# Patient Record
Sex: Male | Born: 2000 | Race: White | Hispanic: No | Marital: Single | Smoking: Never smoker
Health system: Southern US, Community
[De-identification: ages and names within clinical notes are randomized; demographics above are authoritative.]

## PROBLEM LIST (undated history)

## (undated) DIAGNOSIS — F909 Attention-deficit hyperactivity disorder, unspecified type: Secondary | ICD-10-CM

## (undated) HISTORY — PX: WISDOM TOOTH EXTRACTION: SHX21

## (undated) HISTORY — PX: OTHER SURGICAL HISTORY: SHX169

---
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
## 2019-12-07 DIAGNOSIS — Z20822 Contact with and (suspected) exposure to covid-19: Secondary | ICD-10-CM | POA: Diagnosis not present

## 2020-02-09 DIAGNOSIS — G8911 Acute pain due to trauma: Secondary | ICD-10-CM | POA: Diagnosis not present

## 2020-02-09 DIAGNOSIS — S02671A Fracture of alveolus of right mandible, initial encounter for closed fracture: Secondary | ICD-10-CM | POA: Diagnosis not present

## 2020-02-09 DIAGNOSIS — S02601B Fracture of unspecified part of body of right mandible, initial encounter for open fracture: Secondary | ICD-10-CM | POA: Diagnosis not present

## 2020-02-09 DIAGNOSIS — S0990XA Unspecified injury of head, initial encounter: Secondary | ICD-10-CM | POA: Diagnosis not present

## 2020-02-09 DIAGNOSIS — S02609A Fracture of mandible, unspecified, initial encounter for closed fracture: Secondary | ICD-10-CM | POA: Diagnosis not present

## 2020-02-09 DIAGNOSIS — Z20822 Contact with and (suspected) exposure to covid-19: Secondary | ICD-10-CM | POA: Diagnosis not present

## 2020-02-09 DIAGNOSIS — S02601A Fracture of unspecified part of body of right mandible, initial encounter for closed fracture: Secondary | ICD-10-CM | POA: Diagnosis not present

## 2020-02-09 DIAGNOSIS — Y92410 Unspecified street and highway as the place of occurrence of the external cause: Secondary | ICD-10-CM | POA: Diagnosis not present

## 2020-02-09 DIAGNOSIS — S199XXA Unspecified injury of neck, initial encounter: Secondary | ICD-10-CM | POA: Diagnosis not present

## 2020-03-20 ENCOUNTER — Other Ambulatory Visit: Payer: Self-pay | Admitting: Otolaryngology

## 2020-03-20 ENCOUNTER — Other Ambulatory Visit: Payer: Self-pay

## 2020-03-20 ENCOUNTER — Inpatient Hospital Stay (HOSPITAL_COMMUNITY): Payer: BC Managed Care – PPO | Admitting: Certified Registered Nurse Anesthetist

## 2020-03-20 ENCOUNTER — Encounter (HOSPITAL_COMMUNITY): Payer: Self-pay | Admitting: Otolaryngology

## 2020-03-20 ENCOUNTER — Ambulatory Visit
Admission: RE | Admit: 2020-03-20 | Discharge: 2020-03-20 | Disposition: A | Discharge status: Home / Self Care | Payer: BC Managed Care – PPO | Source: Ambulatory Visit | Attending: Otolaryngology | Admitting: Otolaryngology

## 2020-03-20 ENCOUNTER — Encounter (HOSPITAL_COMMUNITY)
Admission: RE | Disposition: A | Discharge status: Home / Self Care | Payer: Self-pay | Source: Ambulatory Visit | Attending: Otolaryngology

## 2020-03-20 ENCOUNTER — Observation Stay (HOSPITAL_COMMUNITY)
Admission: RE | Admit: 2020-03-20 | Discharge: 2020-03-21 | Disposition: A | Discharge status: Home / Self Care | Payer: BC Managed Care – PPO | Source: Ambulatory Visit | Attending: Otolaryngology | Admitting: Otolaryngology

## 2020-03-20 DIAGNOSIS — M272 Inflammatory conditions of jaws: Secondary | ICD-10-CM

## 2020-03-20 DIAGNOSIS — X58XXXA Exposure to other specified factors, initial encounter: Secondary | ICD-10-CM | POA: Insufficient documentation

## 2020-03-20 DIAGNOSIS — L03221 Cellulitis of neck: Secondary | ICD-10-CM | POA: Diagnosis not present

## 2020-03-20 DIAGNOSIS — L0211 Cutaneous abscess of neck: Secondary | ICD-10-CM | POA: Diagnosis not present

## 2020-03-20 DIAGNOSIS — S02609K Fracture of mandible, unspecified, subsequent encounter for fracture with nonunion: Secondary | ICD-10-CM | POA: Diagnosis not present

## 2020-03-20 DIAGNOSIS — S02601K Fracture of unspecified part of body of right mandible, subsequent encounter for fracture with nonunion: Secondary | ICD-10-CM | POA: Diagnosis present

## 2020-03-20 DIAGNOSIS — T8859XA Other complications of anesthesia, initial encounter: Secondary | ICD-10-CM

## 2020-03-20 DIAGNOSIS — Z20822 Contact with and (suspected) exposure to covid-19: Secondary | ICD-10-CM | POA: Insufficient documentation

## 2020-03-20 HISTORY — PX: ORIF MANDIBULAR FRACTURE: SHX2127

## 2020-03-20 HISTORY — DX: Other complications of anesthesia, initial encounter: T88.59XA

## 2020-03-20 LAB — SARS CORONAVIRUS 2 BY RT PCR (HOSPITAL ORDER, PERFORMED IN ~~LOC~~ HOSPITAL LAB): SARS Coronavirus 2: NEGATIVE

## 2020-03-20 LAB — HEMOGLOBIN: Hemoglobin: 15.1 g/dL (ref 13.0–17.0)

## 2020-03-20 SURGERY — OPEN REDUCTION INTERNAL FIXATION (ORIF) MANDIBULAR FRACTURE
Anesthesia: General | Laterality: Right

## 2020-03-20 MED ORDER — DEXMEDETOMIDINE HCL 200 MCG/2ML IV SOLN
INTRAVENOUS | Status: DC | PRN
  Administered 2020-03-20 (×2): 40 ug via INTRAVENOUS

## 2020-03-20 MED ORDER — HYDROMORPHONE HCL 1 MG/ML IJ SOLN
INTRAMUSCULAR | Status: AC
  Filled 2020-03-20: qty 0.5

## 2020-03-20 MED ORDER — OXYCODONE HCL 5 MG/5ML PO SOLN: 5.0000 mg | Freq: Once | ORAL | Status: DC | PRN

## 2020-03-20 MED ORDER — MIDAZOLAM HCL 5 MG/5ML IJ SOLN
INTRAMUSCULAR | Status: DC | PRN
  Administered 2020-03-20: 2 mg via INTRAVENOUS

## 2020-03-20 MED ORDER — PROPOFOL 10 MG/ML IV BOLUS
INTRAVENOUS | Status: AC
  Filled 2020-03-20: qty 20

## 2020-03-20 MED ORDER — LIDOCAINE-EPINEPHRINE 1 %-1:100000 IJ SOLN
INTRAMUSCULAR | Status: AC
  Filled 2020-03-20: qty 1

## 2020-03-20 MED ORDER — ORAL CARE MOUTH RINSE: 15.0000 mL | Freq: Once | OROMUCOSAL | Status: DC

## 2020-03-20 MED ORDER — ACETAMINOPHEN 10 MG/ML IV SOLN: 1000.0000 mg | Freq: Once | INTRAVENOUS | Status: DC | PRN

## 2020-03-20 MED ORDER — DEXAMETHASONE SODIUM PHOSPHATE 10 MG/ML IJ SOLN
INTRAMUSCULAR | Status: AC
  Filled 2020-03-20: qty 1

## 2020-03-20 MED ORDER — MORPHINE SULFATE (PF) 2 MG/ML IV SOLN: 2.0000 mg | INTRAVENOUS | Status: DC | PRN

## 2020-03-20 MED ORDER — SUGAMMADEX SODIUM 200 MG/2ML IV SOLN
INTRAVENOUS | Status: DC | PRN
  Administered 2020-03-20: 200 mg via INTRAVENOUS

## 2020-03-20 MED ORDER — CLINDAMYCIN PHOSPHATE 600 MG/50ML IV SOLN
600.0000 mg | Freq: Once | INTRAVENOUS | Status: AC
  Administered 2020-03-20: 600 mg via INTRAVENOUS
  Filled 2020-03-20: qty 50

## 2020-03-20 MED ORDER — DEXAMETHASONE SODIUM PHOSPHATE 10 MG/ML IJ SOLN
INTRAMUSCULAR | Status: DC | PRN
  Administered 2020-03-20: 10 mg via INTRAVENOUS

## 2020-03-20 MED ORDER — HYDROMORPHONE HCL 1 MG/ML IJ SOLN
INTRAMUSCULAR | Status: DC | PRN
  Administered 2020-03-20: .5 mg via INTRAVENOUS

## 2020-03-20 MED ORDER — ROCURONIUM BROMIDE 10 MG/ML (PF) SYRINGE
PREFILLED_SYRINGE | INTRAVENOUS | Status: AC
  Filled 2020-03-20: qty 10

## 2020-03-20 MED ORDER — FENTANYL CITRATE (PF) 100 MCG/2ML IJ SOLN: 25.0000 ug | INTRAMUSCULAR | Status: DC | PRN

## 2020-03-20 MED ORDER — ROCURONIUM BROMIDE 10 MG/ML (PF) SYRINGE
PREFILLED_SYRINGE | INTRAVENOUS | Status: DC | PRN
  Administered 2020-03-20: 70 mg via INTRAVENOUS

## 2020-03-20 MED ORDER — ONDANSETRON HCL 4 MG/2ML IJ SOLN
INTRAMUSCULAR | Status: AC
  Filled 2020-03-20: qty 2

## 2020-03-20 MED ORDER — OXYMETAZOLINE HCL 0.05 % NA SOLN
NASAL | Status: AC
  Filled 2020-03-20: qty 30

## 2020-03-20 MED ORDER — BACITRACIN ZINC 500 UNIT/GM EX OINT
TOPICAL_OINTMENT | CUTANEOUS | Status: AC
  Filled 2020-03-20: qty 28.35

## 2020-03-20 MED ORDER — KCL IN DEXTROSE-NACL 20-5-0.45 MEQ/L-%-% IV SOLN
INTRAVENOUS | Status: DC
  Filled 2020-03-20: qty 1000

## 2020-03-20 MED ORDER — LIDOCAINE-EPINEPHRINE 1 %-1:100000 IJ SOLN
INTRAMUSCULAR | Status: DC | PRN
  Administered 2020-03-20: 1 mL

## 2020-03-20 MED ORDER — HYDROCODONE-ACETAMINOPHEN 7.5-325 MG/15ML PO SOLN
10.0000 mL | ORAL | Status: DC | PRN
  Administered 2020-03-20 – 2020-03-21 (×2): 15 mL via ORAL
  Filled 2020-03-20 (×2): qty 15

## 2020-03-20 MED ORDER — ONDANSETRON HCL 4 MG/2ML IJ SOLN
INTRAMUSCULAR | Status: DC | PRN
  Administered 2020-03-20: 4 mg via INTRAVENOUS

## 2020-03-20 MED ORDER — 0.9 % SODIUM CHLORIDE (POUR BTL) OPTIME
TOPICAL | Status: DC | PRN
Start: 2020-03-20 — End: 2020-03-20
  Administered 2020-03-20: 1000 mL

## 2020-03-20 MED ORDER — IOPAMIDOL (ISOVUE-300) INJECTION 61%
75.0000 mL | Freq: Once | INTRAVENOUS | Status: AC | PRN
  Administered 2020-03-20: 75 mL via INTRAVENOUS

## 2020-03-20 MED ORDER — ACETAMINOPHEN 160 MG/5ML PO SOLN: 1000.0000 mg | Freq: Once | ORAL | Status: DC | PRN

## 2020-03-20 MED ORDER — OXYMETAZOLINE HCL 0.05 % NA SOLN
NASAL | Status: DC | PRN
  Administered 2020-03-20: 1 via NASAL

## 2020-03-20 MED ORDER — PROPOFOL 10 MG/ML IV BOLUS
INTRAVENOUS | Status: DC | PRN
  Administered 2020-03-20: 200 mg via INTRAVENOUS

## 2020-03-20 MED ORDER — CLINDAMYCIN PHOSPHATE 600 MG/50ML IV SOLN
600.0000 mg | Freq: Three times a day (TID) | INTRAVENOUS | Status: DC
  Administered 2020-03-21 (×2): 600 mg via INTRAVENOUS
  Filled 2020-03-20 (×2): qty 50

## 2020-03-20 MED ORDER — ONDANSETRON HCL 4 MG/2ML IJ SOLN: 4.0000 mg | INTRAMUSCULAR | Status: DC | PRN

## 2020-03-20 MED ORDER — MIDAZOLAM HCL 2 MG/2ML IJ SOLN
INTRAMUSCULAR | Status: AC
  Filled 2020-03-20: qty 2

## 2020-03-20 MED ORDER — FENTANYL CITRATE (PF) 250 MCG/5ML IJ SOLN
INTRAMUSCULAR | Status: AC
  Filled 2020-03-20: qty 5

## 2020-03-20 MED ORDER — LACTATED RINGERS IV SOLN: INTRAVENOUS | Status: DC

## 2020-03-20 MED ORDER — ACETAMINOPHEN 500 MG PO TABS: 1000.0000 mg | ORAL_TABLET | Freq: Once | ORAL | Status: DC | PRN

## 2020-03-20 MED ORDER — ONDANSETRON HCL 4 MG PO TABS: 4.0000 mg | ORAL_TABLET | ORAL | Status: DC | PRN

## 2020-03-20 MED ORDER — LIDOCAINE 2% (20 MG/ML) 5 ML SYRINGE
INTRAMUSCULAR | Status: AC
  Filled 2020-03-20: qty 5

## 2020-03-20 MED ORDER — CHLORHEXIDINE GLUCONATE 0.12 % MT SOLN
15.0000 mL | Freq: Once | OROMUCOSAL | Status: DC
  Filled 2020-03-20: qty 15

## 2020-03-20 MED ORDER — OXYCODONE HCL 5 MG PO TABS: 5.0000 mg | ORAL_TABLET | Freq: Once | ORAL | Status: DC | PRN

## 2020-03-20 MED ORDER — FENTANYL CITRATE (PF) 250 MCG/5ML IJ SOLN
INTRAMUSCULAR | Status: DC | PRN
  Administered 2020-03-20: 150 ug via INTRAVENOUS
  Administered 2020-03-20: 100 ug via INTRAVENOUS

## 2020-03-20 SURGICAL SUPPLY — 45 items
BLADE CLIPPER SURG (BLADE) IMPLANT
BLADE SURG 15 STRL LF DISP TIS (BLADE) IMPLANT
BLADE SURG 15 STRL SS (BLADE)
BNDG GAUZE ELAST 4 BULKY (GAUZE/BANDAGES/DRESSINGS) ×2 IMPLANT
CANISTER SUCT 3000ML PPV (MISCELLANEOUS) ×3 IMPLANT
CLEANER TIP ELECTROSURG 2X2 (MISCELLANEOUS) ×3 IMPLANT
COVER SURGICAL LIGHT HANDLE (MISCELLANEOUS) ×3 IMPLANT
COVER WAND RF STERILE (DRAPES) ×3 IMPLANT
DECANTER SPIKE VIAL GLASS SM (MISCELLANEOUS) ×3 IMPLANT
DRAIN PENROSE 1/4X12 LTX STRL (WOUND CARE) ×2 IMPLANT
DRAPE HALF SHEET 40X57 (DRAPES) IMPLANT
ELECT COATED BLADE 2.86 ST (ELECTRODE) ×3 IMPLANT
ELECT REM PT RETURN 9FT ADLT (ELECTROSURGICAL) ×3
ELECTRODE REM PT RTRN 9FT ADLT (ELECTROSURGICAL) ×1 IMPLANT
GLOVE BIO SURGEON STRL SZ7.5 (GLOVE) ×3 IMPLANT
GOWN STRL REUS W/ TWL LRG LVL3 (GOWN DISPOSABLE) ×2 IMPLANT
GOWN STRL REUS W/TWL LRG LVL3 (GOWN DISPOSABLE) ×6
KIT BASIN OR (CUSTOM PROCEDURE TRAY) ×3 IMPLANT
KIT TURNOVER KIT B (KITS) ×3 IMPLANT
NDL HYPO 25GX1X1/2 BEV (NEEDLE) IMPLANT
NDL PRECISIONGLIDE 27X1.5 (NEEDLE) ×1 IMPLANT
NEEDLE HYPO 25GX1X1/2 BEV (NEEDLE) IMPLANT
NEEDLE PRECISIONGLIDE 27X1.5 (NEEDLE) ×3 IMPLANT
NS IRRIG 1000ML POUR BTL (IV SOLUTION) ×3 IMPLANT
PAD ARMBOARD 7.5X6 YLW CONV (MISCELLANEOUS) ×6 IMPLANT
PENCIL SMOKE EVACUATOR (MISCELLANEOUS) ×3 IMPLANT
POSITIONER HEAD DONUT 9IN (MISCELLANEOUS) IMPLANT
SCISSORS WIRE ANG 4 3/4 DISP (INSTRUMENTS) ×3 IMPLANT
SCREW MNDBLE 2.0X8 LOCKING (Screw) ×4 IMPLANT
SUT ETHILON 2 0 FS 18 (SUTURE) ×2 IMPLANT
SUT ETHILON 4 0 CL P 3 (SUTURE) IMPLANT
SUT MON AB 3-0 SH 27 (SUTURE) ×4
SUT MON AB 3-0 SH27 (SUTURE) ×2 IMPLANT
SUT PROLENE 6 0 PC 1 (SUTURE) IMPLANT
SUT STEEL 0 (SUTURE)
SUT STEEL 0 18XMFL TIE 17 (SUTURE) IMPLANT
SUT STEEL 1 (SUTURE) ×3 IMPLANT
SUT STEEL 2 (SUTURE) IMPLANT
SUT STEEL 4 (SUTURE) ×3 IMPLANT
SUT VICRYL 4-0 PS2 18IN ABS (SUTURE) IMPLANT
TOWEL GREEN STERILE FF (TOWEL DISPOSABLE) ×3 IMPLANT
TRAY ENT MC OR (CUSTOM PROCEDURE TRAY) ×3 IMPLANT
TRAY FOLEY MTR SLVR 14FR STAT (SET/KITS/TRAYS/PACK) IMPLANT
WATER STERILE IRR 1000ML POUR (IV SOLUTION) ×3 IMPLANT
WIRE GUIDE MODEL 22X500MM (WIRE) ×2 IMPLANT

## 2020-03-20 NOTE — Brief Op Note (Signed)
03/20/2020  8:23 PM  PATIENT:  Austin [name]  19 y.o. male  PRE-OPERATIVE DIAGNOSIS:  neck abscess, right body mandible fracture non-union  POST-OPERATIVE DIAGNOSIS:  neck abscess, right body mandible fracture non-union  PROCEDURE:  Procedure(s): REVISION OF  MANDIBULAR  FIXATION AND IRRIGATION AND DEBRIDEMENT OF NECK ABSCESS (Right)  SURGEON:  Surgeon(s) and Role:    Christia Reading, MD - Primary  PHYSICIAN ASSISTANT:   ASSISTANTS: none   ANESTHESIA:   general  EBL:  25 cc  BLOOD ADMINISTERED:none  DRAINS: Penrose drain in the right neck   LOCAL MEDICATIONS USED:  LIDOCAINE   SPECIMEN:  No Specimen  DISPOSITION OF SPECIMEN:  N/A  COUNTS:  YES  TOURNIQUET:  * No tourniquets in log *  DICTATION: .Other Dictation: Dictation Number [id number]  PLAN OF CARE: Admit for overnight observation  PATIENT DISPOSITION:  PACU - hemodynamically stable.   Delay start of Pharmacological VTE agent (>24hrs) due to surgical blood loss or risk of bleeding: no

## 2020-03-20 NOTE — H&P (Signed)
Austin [name] is an 19 y.o. male.   Chief Complaint: Right mandible fracture, neck abscess HPI: 19 year old male suffered right body mandible fracture 4/22 and underwent MMF with arch bars the next day.  He remained in fixation for five weeks and wires were the removed.  Within a few days, he developed swelling and pain of the right peri-mandibular soft tissues.  Yesterday, the neck started draining.  He underwent neck CT today and presents for surgical drainage and replacement of MMF wires.  History reviewed. No pertinent past medical history.  Past Surgical History:  Procedure Laterality Date  . jaw wired    . WISDOM TOOTH EXTRACTION      History reviewed. No pertinent family history. Social History:  reports that he has never smoked. He has never used smokeless tobacco. He reports that he does not drink alcohol or use drugs.  Allergies: No Known Allergies  Medications Prior to Admission  Medication Sig Dispense Refill  . oxyCODONE (ROXICODONE) 5 MG/5ML solution Take 3 mLs by mouth See admin instructions. Take 10 times a day    . sulfamethoxazole-trimethoprim (BACTRIM DS) 800-160 MG tablet Take 1 tablet by mouth 2 (two) times daily.    Marland Kitchen ibuprofen (ADVIL,MOTRIN) 600 MG tablet Take 1 tablet (600 mg total) by mouth every 6 (six) hours as needed. (Patient not taking: Reported on 03/20/2020) 30 tablet 0    Results for orders placed or performed during the hospital encounter of 03/20/20 (from the past 48 hour(s))  Hemoglobin     Status: None   Collection Time: 03/20/20  2:13 PM  Result Value Ref Range   Hemoglobin 15.1 13.0 - 17.0 g/dL    Comment: Performed at [facility], [place] [address] [place], [place] [address]  SARS Coronavirus 2 by RT PCR (hospital order, performed in [facility] hospital lab) Nasopharyngeal Nasopharyngeal Swab     Status: None   Collection Time: 03/20/20  2:48 PM   Specimen: Nasopharyngeal Swab  Result Value Ref Range   SARS Coronavirus 2 NEGATIVE NEGATIVE     Comment: (NOTE) SARS-CoV-2 target nucleic acids are NOT DETECTED. The SARS-CoV-2 RNA is generally detectable in upper and lower respiratory specimens during the acute phase of infection. The lowest concentration of SARS-CoV-2 viral copies this assay can detect is 250 copies / mL. A negative result does not preclude SARS-CoV-2 infection and should not be used as the sole basis for treatment or other patient management decisions.  A negative result may occur with improper specimen collection / handling, submission of specimen other than nasopharyngeal swab, presence of viral mutation(s) within the areas targeted by this assay, and inadequate number of viral copies (<250 copies / mL). A negative result must be combined with clinical observations, patient history, and epidemiological information. Fact Sheet for Patients:   StrictlyIdeas.no Fact Sheet for Healthcare Providers: BankingDealers.co.za This test is not yet approved or cleared  by the [place] FDA and has been authorized for detection and/or diagnosis of SARS-CoV-2 by FDA under an Emergency Use Authorization (EUA).  This EUA will remain in effect (meaning this test can be used) for the duration of the COVID-19 declaration under Section 564(b)(1) of the Act, 21 U.S.C. section 360bbb-3(b)(1), unless the authorization is terminated or revoked sooner. Performed at [facility], [place] [address], [place], [place] [address]    CT SOFT TISSUE NECK W CONTRAST  Result Date: 03/20/2020 CLINICAL DATA:  Mandibular abscess. Cellulitis and abscess of neck. Mandibular abscess/cellulitis and  abscess of neck. EXAM: CT NECK WITH CONTRAST TECHNIQUE: Multidetector CT imaging of the neck was performed using the standard protocol following the bolus administration of intravenous contrast. CONTRAST:  40mL ISOVUE-300 IOPAMIDOL (ISOVUE-300) INJECTION 61% COMPARISON:  No pertinent prior studies  available for comparison. FINDINGS: Pharynx and larynx: There is no appreciable swelling or discrete mass within the oral cavity, pharynx or larynx. Salivary glands: The parotid glands are unremarkable. Inflammatory stranding surrounds both submandibular glands. Thyroid: Unremarkable. Lymph nodes: Bilateral cervical lymphadenopathy, most notably at the level II and I stations, likely reactive. Vascular: The major vascular structures of the neck are patent. Limited intracranial: No abnormality identified. Visualized orbits: Incompletely imaged. Visualized orbits show no acute finding. Mastoids and visualized paranasal sinuses: Trace mucosal thickening within the right maxillary sinus. Skeleton: Comminuted fracture of the right mandibular body which appears acute or early subacute, status post wire fixation. Upper chest: No consolidation within the imaged lung apices. Other: Immediately superficial to the right mandibular body fracture, there is a peripherally enhancing hypodense region measuring 4.9 x 1.3 x 2.9 cm which may reflect phlegmon or early abscess (series 3, image 48) (series 5, image 21). More inferolaterally within the right perimandibular region, there is a 3.4 x 2.1 cm region of induration which appears to predominantly reflect phlegmon, although there may be small central liquid components (series 3, image 58) (series 5, image 26). Additionally, there is a probable small abscess along the inferior aspect of the right masseter muscle measuring 1.6 cm (series 3, image 50). Cellulitis changes extend into the upper neck soft tissues. These results will be called to the ordering clinician or representative by the Radiologist Assistant, and communication documented in the PACS or [employer]. IMPRESSION: Comminuted fracture of the right mandibular body which appears acute or early subacute, status post wire fixation. Prominent cellulitis changes within the right greater than left perimandibular regions  and extending into the upper neck. Immediately superficial to the right mandibular body fracture, there is a peripherally enhancing hypodense region measuring 4.9 x 1.3 x 2.9 cm, which may reflect phlegmon or early abscess. More inferolaterally within the right perimandibular region, there is a 3.4 x 2.1 cm region of soft tissue induration which appears to predominantly reflect phlegmon, although there may be small central liquid components. Additionally, there is a small abscess along the inferior aspect of the right masseter muscle measuring 1.6 cm. Electronically Signed   By: Jackey Loge DO   On: 03/20/2020 12:59    Review of Systems  All other systems reviewed and are negative.   Blood pressure 140/82, pulse 95, temperature 98.3 F (36.8 C), temperature source Oral, resp. rate 18, height 6' (1.829 m), weight 75.8 kg, SpO2 97 %. Physical Exam  Constitutional: He is oriented to person, place, and time. He appears well-developed and well-nourished. No distress.  HENT:  Head: Normocephalic and atraumatic.  Right Ear: External ear normal.  Left Ear: External ear normal.  Nose: Nose normal.  Teeth with good occlusion with arch bars in place.  Pain with flexion of fracture site.  Eyes: Pupils are equal, round, and reactive to light. Conjunctivae and EOM are normal.  Neck:  Right peri-mandibular edema and draining site.  Cardiovascular: Normal rate.  Respiratory: Effort normal.  Neurological: He is alert and oriented to person, place, and time. A cranial nerve deficit (Right chin numbness) is present.  Skin: Skin is warm and dry.  Psychiatric: He has a normal mood and affect. His behavior is normal. Judgment and  thought content normal.     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  Christia Reading, MD 03/20/2020, 7:21 PM

## 2020-03-20 NOTE — Anesthesia Procedure Notes (Signed)
Procedure Name: Intubation Date/Time: 03/20/2020 7:46 PM Performed by: Adonis Housekeeper, CRNA Pre-anesthesia Checklist: Patient identified, Emergency Drugs available, Suction available and Patient being monitored Patient Re-evaluated:Patient Re-evaluated prior to induction Oxygen Delivery Method: Circle system utilized Preoxygenation: Pre-oxygenation with 100% oxygen Induction Type: IV induction Ventilation: Mask ventilation without difficulty Laryngoscope Size: Glidescope and 4 Grade View: Grade I Nasal Tubes: Right, Nasal prep performed and Nasal Rae Tube size: 7.0 mm Number of attempts: 1 Placement Confirmation: ETT inserted through vocal cords under direct vision,  positive ETCO2 and breath sounds checked- equal and bilateral Tube secured with: Tape Dental Injury: Teeth and Oropharynx as per pre-operative assessment

## 2020-03-20 NOTE — Transfer of Care (Signed)
Immediate Anesthesia Transfer of Care Note  Patient: Austin [name]  Procedure(s) Performed: REVISION OF  MANDIBULAR  FIXATION AND IRRIGATION AND DEBRIDEMENT OF NECK ABSCESS (Right )  Patient Location: PACU  Anesthesia Type:General  Level of Consciousness: drowsy  Airway & Oxygen Therapy: Patient Spontanous Breathing  Post-op Assessment: Report given to RN and Post -op Vital signs reviewed and stable  Post vital signs: Reviewed and stable  Last Vitals:  Vitals Value Taken Time  BP 101/50 03/20/20 2037  Temp 36.3 C 03/20/20 2037  Pulse 84 03/20/20 2040  Resp 17 03/20/20 2040  SpO2 93 % 03/20/20 2040  Vitals shown include unvalidated device data.  Last Pain:  Vitals:   03/20/20 2037  TempSrc:   PainSc: Asleep      Patients Stated Pain Goal: 0 (03/20/20 1427)  Complications: No apparent anesthesia complications

## 2020-03-20 NOTE — Anesthesia Preprocedure Evaluation (Signed)
Anesthesia Evaluation  Patient identified by MRN, date of birth, ID band Patient awake    Reviewed: Allergy & Precautions, NPO status , Patient's Chart, lab work & pertinent test results  History of Anesthesia Complications Negative for: history of anesthetic complications  Airway Mallampati: IV  TM Distance: >3 FB Neck ROM: Full  Mouth opening: Limited Mouth Opening  Dental  (+) Dental Advisory Given   Pulmonary neg pulmonary ROS, neg recent URI,    breath sounds clear to auscultation       Cardiovascular negative cardio ROS   Rhythm:Regular     Neuro/Psych negative neurological ROS  negative psych ROS   GI/Hepatic negative GI ROS, Neg liver ROS,   Endo/Other  negative endocrine ROS  Renal/GU negative Renal ROS     Musculoskeletal negative musculoskeletal ROS (+)   Abdominal   Peds  Hematology negative hematology ROS (+)   Anesthesia Other Findings   Reproductive/Obstetrics                             Anesthesia Physical Anesthesia Plan  ASA: I  Anesthesia Plan: General   Post-op Pain Management:    Induction: Intravenous  PONV Risk Score and Plan: 2 and Ondansetron and Dexamethasone  Airway Management Planned: Nasal ETT  Additional Equipment: None  Intra-op Plan:   Post-operative Plan: Extubation in OR  Informed Consent: I have reviewed the patients History and Physical, chart, labs and discussed the procedure including the risks, benefits and alternatives for the proposed anesthesia with the patient or authorized representative who has indicated his/her understanding and acceptance.     Dental advisory given  Plan Discussed with: CRNA and Surgeon  Anesthesia Plan Comments:         Anesthesia Quick Evaluation

## 2020-03-21 ENCOUNTER — Encounter (HOSPITAL_COMMUNITY): Payer: Self-pay | Admitting: Otolaryngology

## 2020-03-21 DIAGNOSIS — X58XXXA Exposure to other specified factors, initial encounter: Secondary | ICD-10-CM | POA: Diagnosis not present

## 2020-03-21 DIAGNOSIS — L0211 Cutaneous abscess of neck: Secondary | ICD-10-CM | POA: Diagnosis not present

## 2020-03-21 DIAGNOSIS — Z20822 Contact with and (suspected) exposure to covid-19: Secondary | ICD-10-CM | POA: Diagnosis not present

## 2020-03-21 DIAGNOSIS — S02609K Fracture of mandible, unspecified, subsequent encounter for fracture with nonunion: Secondary | ICD-10-CM | POA: Diagnosis not present

## 2020-03-21 DIAGNOSIS — S02601K Fracture of unspecified part of body of right mandible, subsequent encounter for fracture with nonunion: Secondary | ICD-10-CM | POA: Diagnosis not present

## 2020-03-21 HISTORY — PX: MANDIBLE SURGERY: SHX707

## 2020-03-21 MED ORDER — CLINDAMYCIN PALMITATE HCL 75 MG/5ML PO SOLR: 300.0000 mg | Freq: Three times a day (TID) | ORAL | 0 refills | Status: AC

## 2020-03-21 NOTE — Anesthesia Postprocedure Evaluation (Signed)
Anesthesia Post Note  Patient: [name]  Procedure(s) Performed: REVISION OF  MANDIBULAR  FIXATION AND IRRIGATION AND DEBRIDEMENT OF NECK ABSCESS (Right )     Patient location during evaluation: PACU Anesthesia Type: General Level of consciousness: awake and alert Pain management: pain level controlled Vital Signs Assessment: post-procedure vital signs reviewed and stable Respiratory status: spontaneous breathing, nonlabored ventilation, respiratory function stable and patient connected to nasal cannula oxygen Cardiovascular status: blood pressure returned to baseline and stable Postop Assessment: no apparent nausea or vomiting Anesthetic complications: no    Last Vitals:  Vitals:   03/20/20 2205 03/21/20 0152  BP: 107/66 122/75  Pulse: 72 62  Resp: 17 17  Temp: 36.8 C (!) 36.4 C  SpO2: 97% 99%    Last Pain:  Vitals:   03/21/20 0152  TempSrc: Oral  PainSc:                  Barrie Sigmund

## 2020-03-21 NOTE — Plan of Care (Signed)
  Problem: Education: Goal: Knowledge of General Education information will improve Description Including pain rating scale, medication(s)/side effects and non-pharmacologic comfort measures Outcome: Progressing   Problem: Health Behavior/Discharge Planning: Goal: Ability to manage health-related needs will improve Outcome: Progressing   Problem: Clinical Measurements: Goal: Ability to maintain clinical measurements within normal limits will improve Outcome: Progressing Goal: Will remain free from infection Outcome: Progressing Goal: Diagnostic test results will improve Outcome: Progressing Goal: Respiratory complications will improve Outcome: Progressing Goal: Cardiovascular complication will be avoided Outcome: Progressing   Problem: Activity: Goal: Risk for activity intolerance will decrease Outcome: Progressing   Problem: Nutrition: Goal: Adequate nutrition will be maintained Outcome: Progressing   Problem: Coping: Goal: Level of anxiety will decrease Outcome: Progressing   Problem: Elimination: Goal: Will not experience complications related to urinary retention Outcome: Progressing   Problem: Pain Managment: Goal: General experience of comfort will improve Outcome: Progressing   Problem: Safety: Goal: Ability to remain free from injury will improve Outcome: Progressing   Problem: Skin Integrity: Goal: Risk for impaired skin integrity will decrease Outcome: Progressing   Problem: Education: Goal: Required Educational Video(s) Outcome: Progressing   Problem: Clinical Measurements: Goal: Postoperative complications will be avoided or minimized Outcome: Progressing   Problem: Skin Integrity: Goal: Demonstration of wound healing without infection will improve Outcome: Progressing   

## 2020-03-21 NOTE — Discharge Summary (Signed)
Physician Discharge Summary  Patient ID: Austin [name] MRN: [id number] DOB/AGE: 04-01-2001 19 y.o.  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  Wires were cut after five weeks and his right neck swelled and began draining pus a few days later.  He was brought to the operating room for replacement of MMF wires and drainage of the infection.  See operative note.  He was observed overnight and did well.  On POD 1, he is felt stable for discharge with the Penrose drain in place.  Consults: None  Significant Diagnostic Studies: None  Treatments: surgery: Revision maxillomandibular fixation, incision and drainage right neck  Discharge Exam: Blood pressure 130/67, pulse 79, temperature 97.8 F (36.6 C), temperature source Oral, resp. rate 18, height 6' (1.829 m), weight 75.8 kg, SpO2 100 %. General appearance: alert, cooperative and no distress Neck: teeth in tight fixation, right neck Penrose drain in place with minor drainage  Disposition: Discharge disposition: 01-Home or Self Care       Discharge Instructions    Diet - low sodium heart healthy   Complete by: As directed    Discharge instructions   Complete by: As directed    Keep wire cutters handy.  Cut wires only if necessary if you need to vomit or are having trouble breathing.  If necessary, call Dr. Jenne Pane immediately.  Resume wired jaw diet.  Change gauze dressing to drain site at least once daily.  Keep drain area dry.   Increase activity slowly   Complete by: As directed      Allergies as of 03/21/2020   No Known Allergies     Medication List     STOP taking these medications   sulfamethoxazole-trimethoprim 800-160 MG tablet Commonly known as: BACTRIM DS     TAKE these medications   clindamycin 75 MG/5ML solution Commonly known as: CLEOCIN Take 20 mLs (300 mg total) by mouth 3 (three) times daily for 10 days.   ibuprofen 600 MG tablet Commonly known as: ADVIL Take 1 tablet (600 mg total) by mouth every 6 (six) hours as needed.   oxyCODONE 5 MG/5ML solution Commonly known as: ROXICODONE Take 3 mLs by mouth See admin instructions. Take 10 times a day      Follow-up Information    Christia Reading, MD. Schedule an appointment as soon as possible for a visit on 03/25/2020.   Specialty: Otolaryngology Why: For drain removal. Contact information: [address] [place] [address] [phone]           Signed: Christia Reading 03/21/2020, 9:05 AM

## 2020-03-21 NOTE — Op Note (Signed)
NAME: [name], [name] MEDICAL RECORD JK:[id number] ACCOUNT [id number] DATE OF BIRTH:01-Oct-2001 FACILITY: [facility] LOCATION: [facility] PHYSICIAN:Aleysha Meckler Pearletha Alfred, MD  OPERATIVE REPORT  DATE OF PROCEDURE:  03/20/2020  PREOPERATIVE DIAGNOSES:   1.  Right body mandible fracture nonunion. 2.  Right neck abscess.  POSTOPERATIVE DIAGNOSES:   1.  Right body mandible fracture nonunion. 2.  Right neck abscess.  PROCEDURE:  Revision maxillomandibular fixation and incision and drainage of right neck abscess.  SURGEON:  Christia Reading, MD  ANESTHESIA:  General endotracheal anesthesia.  COMPLICATIONS:  None.  INDICATIONS:  The patient is a 19 year old male who sustained a comminuted right body mandible fracture on 04/22 and underwent surgical management at that time at [facility] that consisted of placement of maxillomandibular fixation with  arch bars.  No internal fixation was performed at that time due to the comminution.  He did well while in wire fixation for 5 weeks.  His wires were removed about a week ago and a few days later, he developed swelling in the right neck with pain that  spontaneously started draining yesterday.  CT imaging demonstrates continued potential fluid collection around the mandible and the decision was made to proceed with incision and drainage and replacement of maxillomandibular fixation wires.  FINDINGS:  The right neck soft tissues were explored and there was not frank pus, but the fatty tissue was liquified in the abscess space.  The occlusion looked really good, but the fracture was able to be slightly wiggled with manipulation.  The  right-sided posterior screws in the lower arch bar came loose during placement of the wires and so new screws were placed and the posterior most portion of the arch bar removed.  DESCRIPTION OF PROCEDURE:  The patient was identified in the holding room, informed consent having been obtained including discussion of risks,  benefits and alternatives, the patient was brought to the operative suite and put the operative table in  supine position.  Anesthesia was induced and the patient was intubated via nasotracheal intubation without difficulty.  The patient was given intravenous antibiotics during the case.  The eyes were taped closed and the bed was turned 90 degrees from  anesthesia.  The lower face was prepped and draped in sterile fashion.  The teeth were brought into occlusion and 22-gauge wire was looped on the left side first and tightened down.  It was then looped on the right posterior and the inferior bar pulled  out including 2 screws, so the screws were removed and the posterior portion of the arch bar removed as well.  A new 8 mm self-drilling locking screw was placed in 2 sites in the posterior arch bar on the right and this secured the arch bar.  The loop  was then placed there and tightened down.  An additional loop was placed then on each side, totaling 4 loops that were tightened down.  At this point, the right neck was examined and a spontaneous draining site was evident.  Local anesthetic was placed  at that site and the incision made at that site to extend it with a 10 blade scalpel.  Hemostat was then used to bluntly dissect into the abscess cavity and up to the mandible on the buccal surface and then back to the angle and under the inferior  surface of the mandible.  Culture swabs were used then to collect specimens from the abscess cavity.  The space was then copiously irrigated with saline.  A 1/4-inch Penrose drain placed  in the depths of the wound and secured to the skin using 2-0 nylon  suture.  Drapes were removed.  The patient was cleaned off.  Kerlix fluff dressing was placed around the neck.  He was turned back to anesthesia for wakeup and was extubated and taken to the recovery room in stable condition.  CN/NUANCE  D:03/20/2020 T:03/21/2020 JOB:[id number]

## 2020-03-25 LAB — AEROBIC/ANAEROBIC CULTURE W GRAM STAIN (SURGICAL/DEEP WOUND): Culture: NORMAL

## 2020-04-08 ENCOUNTER — Other Ambulatory Visit: Payer: Self-pay | Admitting: Otolaryngology

## 2020-04-08 ENCOUNTER — Encounter (HOSPITAL_COMMUNITY): Payer: Self-pay | Admitting: Otolaryngology

## 2020-04-08 ENCOUNTER — Other Ambulatory Visit (HOSPITAL_COMMUNITY)
Admission: RE | Admit: 2020-04-08 | Discharge: 2020-04-08 | Disposition: A | Discharge status: Home / Self Care | Payer: BC Managed Care – PPO | Source: Ambulatory Visit | Attending: Otolaryngology | Admitting: Otolaryngology

## 2020-04-08 DIAGNOSIS — Z20822 Contact with and (suspected) exposure to covid-19: Secondary | ICD-10-CM | POA: Insufficient documentation

## 2020-04-08 DIAGNOSIS — Z01812 Encounter for preprocedural laboratory examination: Secondary | ICD-10-CM | POA: Diagnosis not present

## 2020-04-08 LAB — SARS CORONAVIRUS 2 (TAT 6-24 HRS): SARS Coronavirus 2: NEGATIVE

## 2020-04-09 ENCOUNTER — Encounter (HOSPITAL_COMMUNITY): Payer: Self-pay | Admitting: Otolaryngology

## 2020-04-09 ENCOUNTER — Other Ambulatory Visit: Payer: Self-pay

## 2020-04-09 NOTE — Progress Notes (Signed)
Austin [name] denies chest pain or shortness of breath. Patient tested negative for Covid_6/21/21_ and has been in quarantine since that time.

## 2020-04-10 ENCOUNTER — Ambulatory Visit (HOSPITAL_COMMUNITY): Payer: BC Managed Care – PPO | Admitting: Certified Registered Nurse Anesthetist

## 2020-04-10 ENCOUNTER — Encounter (HOSPITAL_COMMUNITY)
Admission: RE | Disposition: A | Discharge status: Home / Self Care | Payer: Self-pay | Source: Home / Self Care | Attending: Otolaryngology

## 2020-04-10 ENCOUNTER — Observation Stay (HOSPITAL_COMMUNITY)
Admission: RE | Admit: 2020-04-10 | Discharge: 2020-04-11 | Disposition: A | Discharge status: Home / Self Care | Payer: BC Managed Care – PPO | Attending: Otolaryngology | Admitting: Otolaryngology

## 2020-04-10 ENCOUNTER — Other Ambulatory Visit: Payer: Self-pay

## 2020-04-10 ENCOUNTER — Encounter (HOSPITAL_COMMUNITY): Payer: Self-pay | Admitting: Otolaryngology

## 2020-04-10 DIAGNOSIS — S02601K Fracture of unspecified part of body of right mandible, subsequent encounter for fracture with nonunion: Principal | ICD-10-CM | POA: Insufficient documentation

## 2020-04-10 DIAGNOSIS — S02609A Fracture of mandible, unspecified, initial encounter for closed fracture: Secondary | ICD-10-CM | POA: Diagnosis not present

## 2020-04-10 DIAGNOSIS — X58XXXD Exposure to other specified factors, subsequent encounter: Secondary | ICD-10-CM | POA: Insufficient documentation

## 2020-04-10 HISTORY — PX: ORIF MANDIBULAR FRACTURE: SHX2127

## 2020-04-10 LAB — CBC
HCT: 45.7 % (ref 39.0–52.0)
Hemoglobin: 15.3 g/dL (ref 13.0–17.0)
MCH: 32.7 pg (ref 26.0–34.0)
MCHC: 33.5 g/dL (ref 30.0–36.0)
MCV: 97.6 fL (ref 80.0–100.0)
Platelets: 278 10*3/uL (ref 150–400)
RBC: 4.68 MIL/uL (ref 4.22–5.81)
RDW: 11.9 % (ref 11.5–15.5)
WBC: 5.8 10*3/uL (ref 4.0–10.5)
nRBC: 0 % (ref 0.0–0.2)

## 2020-04-10 SURGERY — OPEN REDUCTION INTERNAL FIXATION (ORIF) MANDIBULAR FRACTURE
Anesthesia: General | Laterality: Right

## 2020-04-10 MED ORDER — ONDANSETRON HCL 4 MG/2ML IJ SOLN: 4.0000 mg | Freq: Four times a day (QID) | INTRAMUSCULAR | Status: DC | PRN

## 2020-04-10 MED ORDER — CLINDAMYCIN PHOSPHATE 600 MG/50ML IV SOLN
600.0000 mg | Freq: Three times a day (TID) | INTRAVENOUS | Status: DC
  Administered 2020-04-10 – 2020-04-11 (×2): 600 mg via INTRAVENOUS
  Filled 2020-04-10 (×2): qty 50

## 2020-04-10 MED ORDER — DEXMEDETOMIDINE HCL IN NACL 200 MCG/50ML IV SOLN
INTRAVENOUS | Status: AC
  Filled 2020-04-10: qty 50

## 2020-04-10 MED ORDER — LIDOCAINE-EPINEPHRINE 1 %-1:100000 IJ SOLN
INTRAMUSCULAR | Status: AC
  Filled 2020-04-10: qty 1

## 2020-04-10 MED ORDER — LIDOCAINE-EPINEPHRINE 1 %-1:100000 IJ SOLN
INTRAMUSCULAR | Status: DC | PRN
  Administered 2020-04-10: 5 mL via INTRADERMAL

## 2020-04-10 MED ORDER — FENTANYL CITRATE (PF) 100 MCG/2ML IJ SOLN
INTRAMUSCULAR | Status: AC
  Filled 2020-04-10: qty 2

## 2020-04-10 MED ORDER — OXYCODONE HCL 5 MG/5ML PO SOLN: 5.0000 mg | Freq: Once | ORAL | Status: DC | PRN

## 2020-04-10 MED ORDER — HYDROCODONE-ACETAMINOPHEN 7.5-325 MG/15ML PO SOLN
10.0000 mL | ORAL | Status: DC | PRN
  Administered 2020-04-10 – 2020-04-11 (×3): 15 mL via ORAL
  Filled 2020-04-10 (×3): qty 15

## 2020-04-10 MED ORDER — BACITRACIN ZINC 500 UNIT/GM EX OINT
TOPICAL_OINTMENT | CUTANEOUS | Status: DC | PRN
  Administered 2020-04-10: 1 via TOPICAL

## 2020-04-10 MED ORDER — PROPOFOL 10 MG/ML IV BOLUS
INTRAVENOUS | Status: DC | PRN
  Administered 2020-04-10: 200 mg via INTRAVENOUS

## 2020-04-10 MED ORDER — MORPHINE SULFATE (PF) 2 MG/ML IV SOLN
2.0000 mg | INTRAVENOUS | Status: DC | PRN
  Administered 2020-04-10: 2 mg via INTRAVENOUS
  Filled 2020-04-10 (×2): qty 1

## 2020-04-10 MED ORDER — LIDOCAINE HCL (CARDIAC) PF 100 MG/5ML IV SOSY
PREFILLED_SYRINGE | INTRAVENOUS | Status: DC | PRN
  Administered 2020-04-10: 100 mg via INTRAVENOUS

## 2020-04-10 MED ORDER — SUGAMMADEX SODIUM 200 MG/2ML IV SOLN
INTRAVENOUS | Status: DC | PRN
  Administered 2020-04-10: 200 mg via INTRAVENOUS

## 2020-04-10 MED ORDER — ONDANSETRON HCL 4 MG/2ML IJ SOLN
INTRAMUSCULAR | Status: DC | PRN
  Administered 2020-04-10: 4 mg via INTRAVENOUS

## 2020-04-10 MED ORDER — LACTATED RINGERS IV SOLN: INTRAVENOUS | Status: DC | PRN

## 2020-04-10 MED ORDER — LACTATED RINGERS IV SOLN: INTRAVENOUS | Status: DC

## 2020-04-10 MED ORDER — FENTANYL CITRATE (PF) 250 MCG/5ML IJ SOLN
INTRAMUSCULAR | Status: AC
  Filled 2020-04-10: qty 5

## 2020-04-10 MED ORDER — MIDAZOLAM HCL 2 MG/2ML IJ SOLN
INTRAMUSCULAR | Status: AC
  Filled 2020-04-10: qty 2

## 2020-04-10 MED ORDER — CLINDAMYCIN PHOSPHATE 600 MG/50ML IV SOLN
INTRAVENOUS | Status: AC
  Filled 2020-04-10: qty 50

## 2020-04-10 MED ORDER — MIDAZOLAM HCL 5 MG/5ML IJ SOLN
INTRAMUSCULAR | Status: DC | PRN
  Administered 2020-04-10: 2 mg via INTRAVENOUS

## 2020-04-10 MED ORDER — KCL IN DEXTROSE-NACL 20-5-0.45 MEQ/L-%-% IV SOLN
INTRAVENOUS | Status: DC
  Filled 2020-04-10 (×2): qty 1000

## 2020-04-10 MED ORDER — DEXAMETHASONE SODIUM PHOSPHATE 10 MG/ML IJ SOLN
INTRAMUSCULAR | Status: AC
  Filled 2020-04-10: qty 1

## 2020-04-10 MED ORDER — ROCURONIUM BROMIDE 10 MG/ML (PF) SYRINGE
PREFILLED_SYRINGE | INTRAVENOUS | Status: AC
  Filled 2020-04-10: qty 10

## 2020-04-10 MED ORDER — FENTANYL CITRATE (PF) 100 MCG/2ML IJ SOLN
25.0000 ug | INTRAMUSCULAR | Status: DC | PRN
  Administered 2020-04-10 (×2): 50 ug via INTRAVENOUS

## 2020-04-10 MED ORDER — CHLORHEXIDINE GLUCONATE 0.12 % MT SOLN
OROMUCOSAL | Status: AC
  Administered 2020-04-10: 15 mL via OROMUCOSAL
  Filled 2020-04-10: qty 15

## 2020-04-10 MED ORDER — ONDANSETRON HCL 4 MG/2ML IJ SOLN
INTRAMUSCULAR | Status: AC
  Filled 2020-04-10: qty 2

## 2020-04-10 MED ORDER — CHLORHEXIDINE GLUCONATE 0.12 % MT SOLN: 15.0000 mL | Freq: Once | OROMUCOSAL | Status: AC

## 2020-04-10 MED ORDER — OXYCODONE HCL 5 MG PO TABS: 5.0000 mg | ORAL_TABLET | Freq: Once | ORAL | Status: DC | PRN

## 2020-04-10 MED ORDER — DEXAMETHASONE SODIUM PHOSPHATE 10 MG/ML IJ SOLN
INTRAMUSCULAR | Status: DC | PRN
  Administered 2020-04-10: 10 mg via INTRAVENOUS

## 2020-04-10 MED ORDER — DEXMEDETOMIDINE HCL 200 MCG/2ML IV SOLN
INTRAVENOUS | Status: DC | PRN
  Administered 2020-04-10: 8 ug via INTRAVENOUS
  Administered 2020-04-10 (×2): 4 ug via INTRAVENOUS

## 2020-04-10 MED ORDER — PROPOFOL 10 MG/ML IV BOLUS
INTRAVENOUS | Status: AC
  Filled 2020-04-10: qty 20

## 2020-04-10 MED ORDER — ORAL CARE MOUTH RINSE: 15.0000 mL | Freq: Once | OROMUCOSAL | Status: AC

## 2020-04-10 MED ORDER — FENTANYL CITRATE (PF) 250 MCG/5ML IJ SOLN
INTRAMUSCULAR | Status: DC | PRN
  Administered 2020-04-10: 50 ug via INTRAVENOUS
  Administered 2020-04-10: 100 ug via INTRAVENOUS
  Administered 2020-04-10: 50 ug via INTRAVENOUS
  Administered 2020-04-10: 100 ug via INTRAVENOUS
  Administered 2020-04-10: 50 ug via INTRAVENOUS

## 2020-04-10 MED ORDER — ROCURONIUM BROMIDE 10 MG/ML (PF) SYRINGE
PREFILLED_SYRINGE | INTRAVENOUS | Status: DC | PRN
  Administered 2020-04-10: 50 mg via INTRAVENOUS

## 2020-04-10 MED ORDER — LIDOCAINE 2% (20 MG/ML) 5 ML SYRINGE
INTRAMUSCULAR | Status: AC
  Filled 2020-04-10: qty 5

## 2020-04-10 MED ORDER — OXYMETAZOLINE HCL 0.05 % NA SOLN
NASAL | Status: AC
  Filled 2020-04-10: qty 30

## 2020-04-10 MED ORDER — BACITRACIN ZINC 500 UNIT/GM EX OINT
TOPICAL_OINTMENT | CUTANEOUS | Status: AC
  Filled 2020-04-10: qty 28.35

## 2020-04-10 MED ORDER — CLINDAMYCIN PHOSPHATE 600 MG/50ML IV SOLN
600.0000 mg | Freq: Once | INTRAVENOUS | Status: AC
  Administered 2020-04-10: 600 mg via INTRAVENOUS

## 2020-04-10 MED ORDER — BACITRACIN ZINC 500 UNIT/GM EX OINT
1.0000 "application " | TOPICAL_OINTMENT | Freq: Three times a day (TID) | CUTANEOUS | Status: DC
  Administered 2020-04-10 – 2020-04-11 (×3): 1 via TOPICAL
  Filled 2020-04-10: qty 28.35

## 2020-04-10 SURGICAL SUPPLY — 64 items
BENZOIN TINCTURE PRP APPL 2/3 (GAUZE/BANDAGES/DRESSINGS) ×2 IMPLANT
BIT DRILL TWIST 1.6X58MM (BIT) ×1 IMPLANT
BLADE CLIPPER SURG (BLADE) IMPLANT
BLADE SURG 15 STRL LF DISP TIS (BLADE) IMPLANT
BLADE SURG 15 STRL SS (BLADE)
BUR EGG ELITE 4.0 (BURR) ×2 IMPLANT
CANISTER SUCT 3000ML PPV (MISCELLANEOUS) ×2 IMPLANT
CLEANER TIP ELECTROSURG 2X2 (MISCELLANEOUS) ×2 IMPLANT
CORD BIPOLAR FORCEPS 12FT (ELECTRODE) ×2 IMPLANT
COVER SURGICAL LIGHT HANDLE (MISCELLANEOUS) ×2 IMPLANT
COVER WAND RF STERILE (DRAPES) IMPLANT
DECANTER SPIKE VIAL GLASS SM (MISCELLANEOUS) ×2 IMPLANT
DRAIN JACKSON RD 7FR 3/32 (WOUND CARE) ×2 IMPLANT
DRAPE HALF SHEET 40X57 (DRAPES) ×2 IMPLANT
DRILL TWIST 1.6X58MM (BIT) ×2
ELECT COATED BLADE 2.86 ST (ELECTRODE) ×2 IMPLANT
ELECT REM PT RETURN 9FT ADLT (ELECTROSURGICAL) ×2
ELECTRODE REM PT RTRN 9FT ADLT (ELECTROSURGICAL) ×1 IMPLANT
EVACUATOR SILICONE 100CC (DRAIN) ×2 IMPLANT
FORCEPS BIPOLAR SPETZLER 8 1.0 (NEUROSURGERY SUPPLIES) ×2 IMPLANT
GAUZE 4X4 16PLY RFD (DISPOSABLE) ×2 IMPLANT
GLOVE BIO SURGEON STRL SZ7.5 (GLOVE) ×2 IMPLANT
GLOVE BIOGEL PI IND STRL 6.5 (GLOVE) ×1 IMPLANT
GLOVE BIOGEL PI INDICATOR 6.5 (GLOVE) ×1
GOWN STRL REUS W/ TWL LRG LVL3 (GOWN DISPOSABLE) ×2 IMPLANT
GOWN STRL REUS W/TWL LRG LVL3 (GOWN DISPOSABLE) ×4
KIT BASIN OR (CUSTOM PROCEDURE TRAY) ×2 IMPLANT
KIT TURNOVER KIT B (KITS) ×2 IMPLANT
LOCATOR NERVE 3 VOLT (DISPOSABLE) ×2 IMPLANT
NEEDLE HYPO 25GX1X1/2 BEV (NEEDLE) ×2 IMPLANT
NEEDLE PRECISIONGLIDE 27X1.5 (NEEDLE) ×2 IMPLANT
NS IRRIG 1000ML POUR BTL (IV SOLUTION) ×2 IMPLANT
PAD ARMBOARD 7.5X6 YLW CONV (MISCELLANEOUS) ×4 IMPLANT
PENCIL SMOKE EVACUATOR (MISCELLANEOUS) ×2 IMPLANT
PLATE RECON 2.5 11H (Plate) ×2 IMPLANT
POSITIONER HEAD DONUT 9IN (MISCELLANEOUS) IMPLANT
SCISSORS WIRE ANG 4 3/4 DISP (INSTRUMENTS) ×2 IMPLANT
SCREW LOCK 2.7X10MM EMERG (Screw) ×2 IMPLANT
SCREW LOCK 2.7X12MM EMERG (Screw) ×2 IMPLANT
SCREW LOCK 2.7X14MM EMERG (Screw) ×8 IMPLANT
SCREW LOCK 2.7X6 EMERG (Screw) ×2 IMPLANT
SCREW LOCK 2.7X8MM EMERG (Screw) ×4 IMPLANT
STAPLER VISISTAT (STAPLE) ×2 IMPLANT
SUT ETHILON 2 0 FS 18 (SUTURE) ×2 IMPLANT
SUT ETHILON 4 0 CL P 3 (SUTURE) IMPLANT
SUT MON AB 3-0 SH 27 (SUTURE) ×2
SUT MON AB 3-0 SH27 (SUTURE) ×1 IMPLANT
SUT PROLENE 6 0 PC 1 (SUTURE) IMPLANT
SUT SILK 3 0 REEL (SUTURE) ×2 IMPLANT
SUT STEEL 0 (SUTURE)
SUT STEEL 0 18XMFL TIE 17 (SUTURE) IMPLANT
SUT STEEL 1 (SUTURE) ×2 IMPLANT
SUT STEEL 2 (SUTURE) IMPLANT
SUT STEEL 4 (SUTURE) ×4 IMPLANT
SUT VIC AB 3-0 SH 27 (SUTURE) ×2
SUT VIC AB 3-0 SH 27X BRD (SUTURE) ×1 IMPLANT
SUT VICRYL 4-0 PS2 18IN ABS (SUTURE) IMPLANT
SYR 20ML LL LF (SYRINGE) ×2 IMPLANT
SYR CONTROL 10ML LL (SYRINGE) ×2 IMPLANT
TAPE STRIPS DRAPE STRL (GAUZE/BANDAGES/DRESSINGS) ×2 IMPLANT
TOWEL GREEN STERILE FF (TOWEL DISPOSABLE) ×2 IMPLANT
TRAY ENT MC OR (CUSTOM PROCEDURE TRAY) ×2 IMPLANT
TRAY FOLEY MTR SLVR 14FR STAT (SET/KITS/TRAYS/PACK) IMPLANT
WATER STERILE IRR 1000ML POUR (IV SOLUTION) ×2 IMPLANT

## 2020-04-10 NOTE — Anesthesia Preprocedure Evaluation (Signed)
Anesthesia Evaluation  Patient identified by MRN, date of birth, ID band Patient awake    Reviewed: Allergy & Precautions, H&P , NPO status , Patient's Chart, lab work & pertinent test results  Airway Mallampati: II   Neck ROM: full    Dental   Pulmonary neg pulmonary ROS,    breath sounds clear to auscultation       Cardiovascular negative cardio ROS   Rhythm:regular Rate:Normal     Neuro/Psych    GI/Hepatic   Endo/Other    Renal/GU      Musculoskeletal   Abdominal   Peds  Hematology   Anesthesia Other Findings   Reproductive/Obstetrics                             Anesthesia Physical Anesthesia Plan  ASA: I  Anesthesia Plan: General   Post-op Pain Management:    Induction: Intravenous  PONV Risk Score and Plan: 2 and Ondansetron, Dexamethasone, Midazolam and Treatment may vary due to age or medical condition  Airway Management Planned: Nasal ETT  Additional Equipment:   Intra-op Plan:   Post-operative Plan: Extubation in OR  Informed Consent: I have reviewed the patients History and Physical, chart, labs and discussed the procedure including the risks, benefits and alternatives for the proposed anesthesia with the patient or authorized representative who has indicated his/her understanding and acceptance.       Plan Discussed with: CRNA, Anesthesiologist and Surgeon  Anesthesia Plan Comments:         Anesthesia Quick Evaluation

## 2020-04-10 NOTE — Op Note (Signed)
Preop diagnosis: Right body mandible fracture with non-union Postop diagnosis: same Procedure: Open reduction, internal fixation right body mandible fracture with interdental fixation Surgeon: Jenne Pane Assist: None Anesth: General and local with 1% lidocaine with 1:100,000 epinephrine Compl: None Findings: Right body comminuted fracture exposed with some mobility but some early bone callous formation.  A 9-hole Leibinger 2.0 mm mandible recon plate was placed. Description:  After discussing risks, benefits, and alternatives, the patient was brought to the operative suite and placed on the operative table in the supine position.  Anesthesia was induced and his maxillomandibular fixation wires were removed to facilitate intubation that was performed nasally by the anesthesia team using the fiberoptic bronchoscope.  The eyes were taped closed and a shoulder roll was placed.  The bed was turned 90 degrees from anesthesia.  The right neck incision was marked with a marking pen and injected with local anesthetic.  The neck and lower face were prepped and draped in sterile fashion.  New maxillomandibular fixation wires were first placed holding his teeth in normal occlusion and using 24 gauge wire with four loops.  The right incision was made with a 15 blade scalpel and extended through the subcutaneous and platysma layers with Bovie electrocautery.  A superior subplatysma flap was then carefully elevated using a 15 blade and Bipolar cautery.  The marginal mandibular nerve and branches were identified using a hand-held nerve stimulator.  Dissection was then performed inferior to the nerve down to the submandibular gland and then extending superiorly keeping the nerve above the dissection.  Tissues were divided down to the lower margin of the mandible.  This required division of the facial vessels and retraction of some facial nodes.  With the inferior margin exposed, soft tissues were elevated off of the external  cortex using electrocautery and a periosteal elevator.  This allowed exposure of the fracture lines and fragments.  There was some early bone formation evident but the fracture was still slightly mobile.  In order to evaluate the anterior body and symphyseal region fully, the right inferior gingivobuccal sulcus was injected with local anesthetic and the mucosa incised with Bovie electrocautery.  Further dissection was extended down to the outer cortex.  Soft tissues were elevated off of the outer cortex and this was connected into the neck approach to fully expose the body of the mandible.  At this point, the outer cortex, through the neck, was shaved down with a cutting burr to improve the contour.  A 9-hole Leibinger 2.0 mm mandibular  Recon plate was then selected.  The template was bent to fit the contour of the mandible and then the plate was bent to match.  The plate was placed and held with clamps.  Each hole was drilled and appropriate length 2.0 mm screws were placed in each hole.  After this was complete, the neck was copiously irrigated with saline.  A 7 French suction drain was placed in the depth of the wound and secured at the skin with a 2-0 Nylon with a standard drain stitch.  The flaps were released and the platysma layer closed with 3-0 Vicryle in a simple, running fashion.  The skin was closed with staples.  The drain was placed to bulb suction.  Bacitracin ointment was added to the incision.  The intraoral incision was closed then with 3-0 Monocryl in a simple, running fashion.  The throat was suctioned.  Drapes were removed and the drain was secured to the shoulder with tape.  He was  then returned to anesthesia for wake-up and was extubated and moved to the recovery room in stable condition.

## 2020-04-10 NOTE — H&P (Signed)
Austin [name] is an 19 y.o. male.   Chief Complaint: Right body mandible fracture HPI: 19 year old sustained a right comminuted mandible fracture in April that was treated with maxillomandibular fixation with arch bars.  After wires were cut five weeks later, he developed infection of the right perimandibular soft tissues that was treated with incision and drainage, antibiotics, and replacement of MMF wires about three weeks ago.  He has had resolution of the infection and presents for further surgical management.  Past Medical History:  Diagnosis Date  . Complication of anesthesia 03/20/2020   hard to wake     Past Surgical History:  Procedure Laterality Date  . jaw wired    . MANDIBLE SURGERY  03/21/2020   REVISION OF  MANDIBULAR  FIXATION AND IRRIGATION AND DEBRIDEMENT OF NECK ABSCESS (Right )  . ORIF MANDIBULAR FRACTURE Right 03/20/2020   Procedure: REVISION OF  MANDIBULAR  FIXATION AND IRRIGATION AND DEBRIDEMENT OF NECK ABSCESS;  Surgeon: Christia Reading, MD;  Location: [facility] OR;  Service: ENT;  Laterality: Right;  . WISDOM TOOTH EXTRACTION      History reviewed. No pertinent family history. Social History:  reports that he has never smoked. He has never used smokeless tobacco. He reports that he does not drink alcohol and does not use drugs.  Allergies: No Known Allergies  No medications prior to admission.    Results for orders placed or performed during the hospital encounter of 04/08/20 (from the past 48 hour(s))  SARS CORONAVIRUS 2 (TAT 6-24 HRS) Nasopharyngeal Nasopharyngeal Swab     Status: None   Collection Time: 04/08/20  2:58 PM   Specimen: Nasopharyngeal Swab  Result Value Ref Range   SARS Coronavirus 2 NEGATIVE NEGATIVE    Comment: (NOTE) SARS-CoV-2 target nucleic acids are NOT DETECTED.  The SARS-CoV-2 RNA is generally detectable in upper and lower respiratory specimens during the acute phase of infection. Negative results do not preclude SARS-CoV-2 infection, do not  rule out co-infections with other pathogens, and should not be used as the sole basis for treatment or other patient management decisions. Negative results must be combined with clinical observations, patient history, and epidemiological information. The expected result is Negative.  Fact Sheet for Patients: HairSlick.no  Fact Sheet for Healthcare Providers: quierodirigir.com  This test is not yet approved or cleared by the [place] FDA and  has been authorized for detection and/or diagnosis of SARS-CoV-2 by FDA under an Emergency Use Authorization (EUA). This EUA will remain  in effect (meaning this test can be used) for the duration of the COVID-19 declaration under Se ction 564(b)(1) of the Act, 21 U.S.C. section 360bbb-3(b)(1), unless the authorization is terminated or revoked sooner.  Performed at [facility], [address], [place], [place] [address]    No results found.  Review of Systems  All other systems reviewed and are negative.   Blood pressure (!) 121/52, pulse 61, temperature (!) 97.5 F (36.4 C), resp. rate 17, height 6' (1.829 m), weight 72.6 kg, SpO2 100 %. Physical Exam  HENT:  Head: Normocephalic and atraumatic.  Right Ear: External ear normal.  Left Ear: External ear normal.  Nose: Nose normal.  Mouth/Throat: Mucous membranes are moist. Oropharynx is clear.  Eyes: Pupils are equal, round, and reactive to light. Conjunctivae are normal.  Neck:  Dry wound inferior to right body of mandible.  Some firmness but no significant edema.  Cardiovascular: Normal rate.  Respiratory: Effort normal.  Neurological: He is alert.  Skin: Skin is warm and dry.  Psychiatric: His behavior is normal. Mood, judgment and thought content normal.     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  Risks, benefits, and alternatives were discussed and he expressed understanding and agreement.  Melida Quitter, MD 04/10/2020, 10:37 AM

## 2020-04-10 NOTE — Anesthesia Procedure Notes (Signed)
Procedure Name: Intubation Date/Time: 04/10/2020 12:29 PM Performed by: Montez Morita, Seung Nidiffer W, CRNA Pre-anesthesia Checklist: Patient identified, Emergency Drugs available, Suction available and Patient being monitored Patient Re-evaluated:Patient Re-evaluated prior to induction Oxygen Delivery Method: Circle system utilized Preoxygenation: Pre-oxygenation with 100% oxygen Induction Type: IV induction Ventilation: Mask ventilation without difficulty Laryngoscope Size: Miller and 2 (fiberoptic) Nasal Tubes: Nasal prep performed and Nasal Sheilah Pigeon Number of attempts: 2 Airway Equipment and Method: Fiberoptic brochoscope Placement Confirmation: ETT inserted through vocal cords under direct vision,  positive ETCO2 and breath sounds checked- equal and bilateral Secured at: 28 cm Tube secured with: Tape Dental Injury: Teeth and Oropharynx as per pre-operative assessment  Comments: DL x1, jaw very tight from being wired for several weeks. Used fiberoptic nasal to place nasal tube with ease, after wires were cut per Jenne Pane.

## 2020-04-10 NOTE — Transfer of Care (Signed)
Immediate Anesthesia Transfer of Care Note  Patient: Austin [name]  Procedure(s) Performed: OPEN REDUCTION INTERNAL FIXATION (ORIF) MANDIBULAR FRACTURE (Right )  Patient Location: PACU  Anesthesia Type:General  Level of Consciousness: awake, alert  and oriented  Airway & Oxygen Therapy: Patient Spontanous Breathing and Patient connected to face mask oxygen  Post-op Assessment: Report given to RN and Post -op Vital signs reviewed and stable  Post vital signs: Reviewed and stable  Last Vitals:  Vitals Value Taken Time  BP 135/78 04/10/20 1534  Temp    Pulse 75 04/10/20 1536  Resp 23 04/10/20 1536  SpO2 95 % 04/10/20 1536  Vitals shown include unvalidated device data.  Last Pain:  Vitals:   04/10/20 1048  PainSc: 0-No pain         Complications: No complications documented.

## 2020-04-10 NOTE — Brief Op Note (Signed)
04/10/2020  3:24 PM  PATIENT:  Magnus Ivan  19 y.o. male  PRE-OPERATIVE DIAGNOSIS:  Right body mandible fracture with non-union  POST-OPERATIVE DIAGNOSIS:  Right body mandible fracture with non-union  PROCEDURE:  Procedure(s): OPEN REDUCTION INTERNAL FIXATION (ORIF) MANDIBULAR FRACTURE (Right)  SURGEON:  Surgeon(s) and Role:    Christia Reading, MD - Primary  PHYSICIAN ASSISTANT:   ASSISTANTS: none   ANESTHESIA:   general  EBL:  25 mL   BLOOD ADMINISTERED:none  DRAINS: (7 Fr) Jackson-Pratt drain(s) with closed bulb suction in the right neck   LOCAL MEDICATIONS USED:  LIDOCAINE   SPECIMEN:  No Specimen  DISPOSITION OF SPECIMEN:  N/A  COUNTS:  YES  TOURNIQUET:  * No tourniquets in log *  DICTATION: .Note written in EPIC  PLAN OF CARE: Admit for overnight observation  PATIENT DISPOSITION:  PACU - hemodynamically stable.   Delay start of Pharmacological VTE agent (>24hrs) due to surgical blood loss or risk of bleeding: no

## 2020-04-11 ENCOUNTER — Encounter (HOSPITAL_COMMUNITY): Payer: Self-pay | Admitting: Otolaryngology

## 2020-04-11 DIAGNOSIS — X58XXXD Exposure to other specified factors, subsequent encounter: Secondary | ICD-10-CM | POA: Diagnosis not present

## 2020-04-11 DIAGNOSIS — S02601K Fracture of unspecified part of body of right mandible, subsequent encounter for fracture with nonunion: Secondary | ICD-10-CM | POA: Diagnosis not present

## 2020-04-11 DIAGNOSIS — S02609A Fracture of mandible, unspecified, initial encounter for closed fracture: Secondary | ICD-10-CM | POA: Diagnosis not present

## 2020-04-11 MED ORDER — PHENYLEPHRINE 40 MCG/ML (10ML) SYRINGE FOR IV PUSH (FOR BLOOD PRESSURE SUPPORT)
PREFILLED_SYRINGE | INTRAVENOUS | Status: AC
  Filled 2020-04-11: qty 10

## 2020-04-11 NOTE — Plan of Care (Signed)
  Problem: Education: Goal: Knowledge of General Education information will improve Description: Including pain rating scale, medication(s)/side effects and non-pharmacologic comfort measures Outcome: Progressing  Aeb pt verbalzies poc this AM Problem: Health Behavior/Discharge Planning: Goal: Ability to manage health-related needs will improve Outcome: Progressing  Aeb pt participates in poc  Problem: Clinical Measurements: Goal: Ability to maintain clinical measurements within normal limits will improve Outcome: Progressing  Aeb no new s/s

## 2020-04-11 NOTE — Anesthesia Postprocedure Evaluation (Signed)
Anesthesia Post Note  Patient: Austin [name]  Procedure(s) Performed: OPEN REDUCTION INTERNAL FIXATION (ORIF) MANDIBULAR FRACTURE (Right )     Patient location during evaluation: PACU Anesthesia Type: General Level of consciousness: awake and alert Pain management: pain level controlled Vital Signs Assessment: post-procedure vital signs reviewed and stable Respiratory status: spontaneous breathing, nonlabored ventilation, respiratory function stable and patient connected to nasal cannula oxygen Cardiovascular status: blood pressure returned to baseline and stable Postop Assessment: no apparent nausea or vomiting Anesthetic complications: no   No complications documented.  Last Vitals:  Vitals:   04/11/20 0100 04/11/20 0520  BP: 133/84 108/62  Pulse: 63 (!) 59  Resp: 16   Temp: 36.5 C 36.6 C  SpO2: 98% 98%    Last Pain:  Vitals:   04/11/20 1000  TempSrc:   PainSc: 3                  Shantia Sanford S

## 2020-04-11 NOTE — Discharge Summary (Signed)
Physician Discharge Summary  Patient ID: Austin [name] MRN: [id number] DOB/AGE: Mar 12, 2001 19 y.o.  Admit date: 04/10/2020 Discharge date: 04/11/2020  Admission Diagnoses: Right body mandible fracture with non-union  Discharge Diagnoses:  Active Problems:   Mandible fracture [facility])   Discharged Condition: good  Hospital Course: 19 year old male who suffered a comminuted right body mandible fracture in April that was treated with MMF for five weeks and then wires were cut.  Soon after, he developed infection that was treated with I&D and replacement of wires.  With resolution of infection, he presented for ORIF of the mandible fracture.  See operative note.  He was observed overnight after surgery with a drain in place and did quite well.  On POD 1, the drain was removed and he was felt stable for discharge.  Consults: None  Significant Diagnostic Studies: None  Treatments: surgery: ORIF right body mandible fracture  Discharge Exam: Blood pressure 108/62, pulse (!) 59, temperature 97.8 F (36.6 C), temperature source Axillary, resp. rate 16, height 6' (1.829 m), weight 72.6 kg, SpO2 98 %. General appearance: alert, cooperative and no distress Neck: drain removed, incision clean and intact, edema, mild right lower lip weakness, teeth in tight fixation with arch bars  Disposition: Discharge disposition: 01-Home or Self Care       Discharge Instructions    Diet - low sodium heart healthy   Complete by: As directed    Discharge instructions   Complete by: As directed    Apply antibiotic ointment to incision twice daily.  OK to allow it to get wet, gently pat dry.  Use your pain medicine if needed.   Increase activity slowly   Complete by: As directed    No dressing needed   Complete by: As directed      Allergies as of 04/11/2020   No Known Allergies     Medication List    You have not been prescribed any medications.          Discharge Care Instructions  (From  admission, onward)         Start     Ordered   04/11/20 0000  No dressing needed        04/11/20 0820          Follow-up Information    Christia Reading, MD. Schedule an appointment as soon as possible for a visit in 1 week.   Specialty: Otolaryngology Contact information: [address] [place] [address] [phone]               Signed: Christia Reading 04/11/2020, 8:21 AM

## 2020-04-16 DIAGNOSIS — Z9889 Other specified postprocedural states: Secondary | ICD-10-CM | POA: Diagnosis not present

## 2020-04-16 DIAGNOSIS — T8149XA Infection following a procedure, other surgical site, initial encounter: Secondary | ICD-10-CM | POA: Diagnosis not present

## 2020-04-16 DIAGNOSIS — S02601K Fracture of unspecified part of body of right mandible, subsequent encounter for fracture with nonunion: Secondary | ICD-10-CM | POA: Diagnosis not present

## 2020-04-16 DIAGNOSIS — Y838 Other surgical procedures as the cause of abnormal reaction of the patient, or of later complication, without mention of misadventure at the time of the procedure: Secondary | ICD-10-CM | POA: Diagnosis not present

## 2020-04-19 ENCOUNTER — Encounter (HOSPITAL_COMMUNITY): Payer: Self-pay | Admitting: Otolaryngology

## 2020-05-09 ENCOUNTER — Encounter (HOSPITAL_BASED_OUTPATIENT_CLINIC_OR_DEPARTMENT_OTHER): Payer: Self-pay | Admitting: Otolaryngology

## 2020-05-09 ENCOUNTER — Other Ambulatory Visit: Payer: Self-pay

## 2020-05-14 ENCOUNTER — Other Ambulatory Visit (HOSPITAL_COMMUNITY): Payer: BC Managed Care – PPO | Attending: Otolaryngology

## 2020-05-15 ENCOUNTER — Other Ambulatory Visit (HOSPITAL_COMMUNITY)
Admission: RE | Admit: 2020-05-15 | Discharge: 2020-05-15 | Disposition: A | Discharge status: Home / Self Care | Payer: BC Managed Care – PPO | Source: Ambulatory Visit | Attending: Otolaryngology | Admitting: Otolaryngology

## 2020-05-15 DIAGNOSIS — Z01812 Encounter for preprocedural laboratory examination: Secondary | ICD-10-CM | POA: Diagnosis not present

## 2020-05-15 DIAGNOSIS — Z20822 Contact with and (suspected) exposure to covid-19: Secondary | ICD-10-CM | POA: Insufficient documentation

## 2020-05-15 LAB — SARS CORONAVIRUS 2 (TAT 6-24 HRS): SARS Coronavirus 2: NEGATIVE

## 2020-05-17 ENCOUNTER — Ambulatory Visit (HOSPITAL_BASED_OUTPATIENT_CLINIC_OR_DEPARTMENT_OTHER)
Admission: RE | Admit: 2020-05-17 | Discharge: 2020-05-17 | Disposition: A | Discharge status: Home / Self Care | Payer: BC Managed Care – PPO | Attending: Otolaryngology | Admitting: Otolaryngology

## 2020-05-17 ENCOUNTER — Other Ambulatory Visit: Payer: Self-pay

## 2020-05-17 ENCOUNTER — Ambulatory Visit (HOSPITAL_BASED_OUTPATIENT_CLINIC_OR_DEPARTMENT_OTHER): Payer: BC Managed Care – PPO | Admitting: Anesthesiology

## 2020-05-17 ENCOUNTER — Encounter (HOSPITAL_BASED_OUTPATIENT_CLINIC_OR_DEPARTMENT_OTHER)
Admission: RE | Disposition: A | Discharge status: Home / Self Care | Payer: Self-pay | Source: Home / Self Care | Attending: Otolaryngology

## 2020-05-17 ENCOUNTER — Encounter (HOSPITAL_BASED_OUTPATIENT_CLINIC_OR_DEPARTMENT_OTHER): Payer: Self-pay | Admitting: Otolaryngology

## 2020-05-17 DIAGNOSIS — S02601D Fracture of unspecified part of body of right mandible, subsequent encounter for fracture with routine healing: Secondary | ICD-10-CM | POA: Diagnosis not present

## 2020-05-17 DIAGNOSIS — S02601K Fracture of unspecified part of body of right mandible, subsequent encounter for fracture with nonunion: Secondary | ICD-10-CM | POA: Diagnosis not present

## 2020-05-17 DIAGNOSIS — Z472 Encounter for removal of internal fixation device: Secondary | ICD-10-CM | POA: Diagnosis not present

## 2020-05-17 HISTORY — PX: MANDIBULAR HARDWARE REMOVAL: SHX5205

## 2020-05-17 SURGERY — REMOVAL, HARDWARE, MANDIBLE
Anesthesia: General | Site: Mouth

## 2020-05-17 MED ORDER — MIDAZOLAM HCL 2 MG/2ML IJ SOLN
INTRAMUSCULAR | Status: AC
  Filled 2020-05-17: qty 2

## 2020-05-17 MED ORDER — MIDAZOLAM HCL 5 MG/5ML IJ SOLN
INTRAMUSCULAR | Status: DC | PRN
  Administered 2020-05-17: 2 mg via INTRAVENOUS

## 2020-05-17 MED ORDER — LACTATED RINGERS IV SOLN: INTRAVENOUS | Status: DC

## 2020-05-17 MED ORDER — ONDANSETRON HCL 4 MG/2ML IJ SOLN
INTRAMUSCULAR | Status: DC | PRN
  Administered 2020-05-17: 4 mg via INTRAVENOUS

## 2020-05-17 MED ORDER — LIDOCAINE HCL (CARDIAC) PF 100 MG/5ML IV SOSY
PREFILLED_SYRINGE | INTRAVENOUS | Status: DC | PRN
  Administered 2020-05-17: 60 mg via INTRAVENOUS

## 2020-05-17 MED ORDER — DEXMEDETOMIDINE (PRECEDEX) IN NS 20 MCG/5ML (4 MCG/ML) IV SYRINGE
PREFILLED_SYRINGE | INTRAVENOUS | Status: AC
  Filled 2020-05-17: qty 5

## 2020-05-17 MED ORDER — FENTANYL CITRATE (PF) 100 MCG/2ML IJ SOLN
INTRAMUSCULAR | Status: AC
  Filled 2020-05-17: qty 2

## 2020-05-17 MED ORDER — FENTANYL CITRATE (PF) 100 MCG/2ML IJ SOLN: 25.0000 ug | INTRAMUSCULAR | Status: DC | PRN

## 2020-05-17 MED ORDER — PROPOFOL 500 MG/50ML IV EMUL
INTRAVENOUS | Status: DC | PRN
  Administered 2020-05-17: 75 ug/kg/min via INTRAVENOUS

## 2020-05-17 MED ORDER — PROMETHAZINE HCL 25 MG/ML IJ SOLN: 6.2500 mg | INTRAMUSCULAR | Status: DC | PRN

## 2020-05-17 MED ORDER — ACETAMINOPHEN 500 MG PO TABS
ORAL_TABLET | ORAL | Status: AC
  Filled 2020-05-17: qty 2

## 2020-05-17 MED ORDER — FENTANYL CITRATE (PF) 100 MCG/2ML IJ SOLN
INTRAMUSCULAR | Status: DC | PRN
  Administered 2020-05-17: 50 ug via INTRAVENOUS

## 2020-05-17 MED ORDER — PROPOFOL 500 MG/50ML IV EMUL
INTRAVENOUS | Status: AC
  Filled 2020-05-17: qty 50

## 2020-05-17 MED ORDER — PROPOFOL 10 MG/ML IV BOLUS
INTRAVENOUS | Status: DC | PRN
  Administered 2020-05-17 (×2): 20 mg via INTRAVENOUS
  Administered 2020-05-17: 100 mg via INTRAVENOUS
  Administered 2020-05-17 (×4): 20 mg via INTRAVENOUS

## 2020-05-17 MED ORDER — ACETAMINOPHEN 500 MG PO TABS
1000.0000 mg | ORAL_TABLET | Freq: Once | ORAL | Status: AC
  Administered 2020-05-17: 1000 mg via ORAL

## 2020-05-17 SURGICAL SUPPLY — 25 items
BLADE SURG 15 STRL LF DISP TIS (BLADE) ×1 IMPLANT
BLADE SURG 15 STRL SS (BLADE) ×2
CANISTER SUCT 1200ML W/VALVE (MISCELLANEOUS) ×2 IMPLANT
COVER MAYO STAND STRL (DRAPES) ×2 IMPLANT
COVER WAND RF STERILE (DRAPES) IMPLANT
DECANTER SPIKE VIAL GLASS SM (MISCELLANEOUS) ×2 IMPLANT
ELECT COATED BLADE 2.86 ST (ELECTRODE) ×2 IMPLANT
ELECT REM PT RETURN 9FT ADLT (ELECTROSURGICAL) ×2
ELECTRODE REM PT RTRN 9FT ADLT (ELECTROSURGICAL) ×1 IMPLANT
GAUZE 4X4 16PLY RFD (DISPOSABLE) IMPLANT
GAUZE SPONGE 4X4 12PLY STRL LF (GAUZE/BANDAGES/DRESSINGS) ×4 IMPLANT
GLOVE BIO SURGEON STRL SZ7.5 (GLOVE) ×2 IMPLANT
NEEDLE PRECISIONGLIDE 27X1.5 (NEEDLE) ×2 IMPLANT
NS IRRIG 1000ML POUR BTL (IV SOLUTION) ×2 IMPLANT
PACK BASIN DAY SURGERY FS (CUSTOM PROCEDURE TRAY) ×2 IMPLANT
PENCIL SMOKE EVACUATOR (MISCELLANEOUS) ×2 IMPLANT
SCISSORS WIRE ANG 4 3/4 DISP (INSTRUMENTS) ×2 IMPLANT
SHEET MEDIUM DRAPE 40X70 STRL (DRAPES) ×2 IMPLANT
SUT MNCRL AB 3-0 PS2 18 (SUTURE) ×2 IMPLANT
SYR 20ML LL LF (SYRINGE) IMPLANT
SYR CONTROL 10ML LL (SYRINGE) ×2 IMPLANT
TOWEL GREEN STERILE FF (TOWEL DISPOSABLE) ×4 IMPLANT
TRAY DSU PREP LF (CUSTOM PROCEDURE TRAY) IMPLANT
TUBE CONNECTING 20X1/4 (TUBING) ×2 IMPLANT
YANKAUER SUCT BULB TIP NO VENT (SUCTIONS) ×2 IMPLANT

## 2020-05-17 NOTE — Transfer of Care (Signed)
Immediate Anesthesia Transfer of Care Note  Patient: Austin [name]  Procedure(s) Performed: MANDIBULAR HARDWARE REMOVAL (N/A Mouth)  Patient Location: PACU  Anesthesia Type:General  Level of Consciousness: awake, alert , oriented and patient cooperative  Airway & Oxygen Therapy: Patient Spontanous Breathing and Patient connected to face mask oxygen  Post-op Assessment: Report given to RN and Post -op Vital signs reviewed and stable  Post vital signs: Reviewed and stable  Last Vitals:  Vitals Value Taken Time  BP 124/72 05/17/20 1353  Temp    Pulse 85 05/17/20 1355  Resp 15 05/17/20 1355  SpO2 100 % 05/17/20 1355  Vitals shown include unvalidated device data.  Last Pain:  Vitals:   05/17/20 1050  TempSrc: Oral  PainSc: 0-No pain         Complications: No complications documented.

## 2020-05-17 NOTE — Anesthesia Procedure Notes (Signed)
Procedure Name: General with mask airway Date/Time: 05/17/2020 1:41 PM Performed by: Sheryn Bison, CRNA Pre-anesthesia Checklist: Patient identified, Emergency Drugs available, Suction available, Patient being monitored and Timeout performed Patient Re-evaluated:Patient Re-evaluated prior to induction Oxygen Delivery Method: Simple face mask

## 2020-05-17 NOTE — H&P (Signed)
Austin [name] is an 19 y.o. male.   Chief Complaint: Mandible fracture HPI: 19 year old with complicated course of mandibular fracture management presents for removal of arch bars.  Past Medical History:  Diagnosis Date  . Complication of anesthesia 03/20/2020   hard to wake     Past Surgical History:  Procedure Laterality Date  . jaw wired    . MANDIBLE SURGERY  03/21/2020   REVISION OF  MANDIBULAR  FIXATION AND IRRIGATION AND DEBRIDEMENT OF NECK ABSCESS (Right )  . ORIF MANDIBULAR FRACTURE Right 04/10/2020   Procedure: OPEN REDUCTION INTERNAL FIXATION (ORIF) MANDIBULAR FRACTURE;  Surgeon: Christia Reading, MD;  Location: [facility] OR;  Service: ENT;  Laterality: Right;  . ORIF MANDIBULAR FRACTURE Right 03/20/2020   Procedure: REVISION OF  MANDIBULAR  FIXATION AND IRRIGATION AND DEBRIDEMENT OF NECK ABSCESS;  Surgeon: Christia Reading, MD;  Location: [facility] OR;  Service: ENT;  Laterality: Right;  . WISDOM TOOTH EXTRACTION      History reviewed. No pertinent family history. Social History:  reports that he has never smoked. He has never used smokeless tobacco. He reports that he does not drink alcohol and does not use drugs.  Allergies: No Known Allergies  No medications prior to admission.    No results found for this or any previous visit (from the past 48 hour(s)). No results found.  Review of Systems  All other systems reviewed and are negative.   Blood pressure 117/68, pulse 63, temperature 97.9 F (36.6 C), temperature source Oral, resp. rate 18, height 6' (1.829 m), weight 74.4 kg, SpO2 98 %. Physical Exam Constitutional:      Appearance: Normal appearance. He is normal weight.  HENT:     Head: Normocephalic.     Right Ear: External ear normal.     Left Ear: External ear normal.     Nose: Nose normal.     Mouth/Throat:     Mouth: Mucous membranes are moist.     Pharynx: Oropharynx is clear.  Eyes:     Extraocular Movements: Extraocular movements intact.     Conjunctiva/sclera:  Conjunctivae normal.     Pupils: Pupils are equal, round, and reactive to light.  Cardiovascular:     Rate and Rhythm: Normal rate.  Pulmonary:     Effort: Pulmonary effort is normal.  Skin:    General: Skin is warm.  Neurological:     General: No focal deficit present.     Mental Status: He is alert and oriented to person, place, and time.  Psychiatric:        Mood and Affect: Mood normal.        Behavior: Behavior normal.        Thought Content: Thought content normal.        Judgment: Judgment normal.      Assessment/Plan Mandible fracture  To OR for removal of arch bars and wires.  Christia Reading, MD 05/17/2020, 12:54 PM

## 2020-05-17 NOTE — Anesthesia Preprocedure Evaluation (Addendum)
Anesthesia Evaluation  Patient identified by MRN, date of birth, ID band Patient awake    Reviewed: Allergy & Precautions, NPO status , Patient's Chart, lab work & pertinent test results  Airway Mallampati: III  TM Distance: >3 FB Neck ROM: Full  Mouth opening: Limited Mouth Opening  Dental  (+) Dental Advisory Given   Pulmonary neg pulmonary ROS,    breath sounds clear to auscultation       Cardiovascular negative cardio ROS   Rhythm:Regular Rate:Normal     Neuro/Psych negative neurological ROS     GI/Hepatic negative GI ROS, Neg liver ROS,   Endo/Other  negative endocrine ROS  Renal/GU negative Renal ROS     Musculoskeletal   Abdominal   Peds  Hematology negative hematology ROS (+)   Anesthesia Other Findings   Reproductive/Obstetrics                            Anesthesia Physical Anesthesia Plan  ASA: I  Anesthesia Plan: General   Post-op Pain Management:    Induction: Intravenous  PONV Risk Score and Plan: 2 and Dexamethasone, Ondansetron and Treatment may vary due to age or medical condition  Airway Management Planned: Oral ETT and Video Laryngoscope Planned  Additional Equipment: None  Intra-op Plan:   Post-operative Plan: Extubation in OR  Informed Consent: I have reviewed the patients History and Physical, chart, labs and discussed the procedure including the risks, benefits and alternatives for the proposed anesthesia with the patient or authorized representative who has indicated his/her understanding and acceptance.     Dental advisory given  Plan Discussed with: CRNA  Anesthesia Plan Comments:        Anesthesia Quick Evaluation

## 2020-05-17 NOTE — Brief Op Note (Signed)
05/17/2020  1:42 PM  PATIENT:  Austin [name]  19 y.o. male  PRE-OPERATIVE DIAGNOSIS:  mandibular fracture haredware  POST-OPERATIVE DIAGNOSIS:  mandibular fracture hardware   PROCEDURE:  Procedure(s): MANDIBULAR HARDWARE REMOVAL (N/A)  SURGEON:  Surgeon(s) and Role:    Christia Reading, MD - Primary  PHYSICIAN ASSISTANT:   ASSISTANTS: none   ANESTHESIA:   general  EBL:  Minimal  BLOOD ADMINISTERED:none  DRAINS: none   LOCAL MEDICATIONS USED:  NONE  SPECIMEN:  No Specimen  DISPOSITION OF SPECIMEN:  N/A  COUNTS:  YES  TOURNIQUET:  * No tourniquets in log *  DICTATION: .Note written in EPIC  PLAN OF CARE: Discharge to home after PACU  PATIENT DISPOSITION:  PACU - hemodynamically stable.   Delay start of Pharmacological VTE agent (>24hrs) due to surgical blood loss or risk of bleeding: no

## 2020-05-17 NOTE — Op Note (Signed)
Preop diagnosis: Mandible fracture Postop diagnosis: same Procedure: Removal of mandibular hardware Surgeon: Jenne Pane Anesth: General Compl: None Findings: Arch bars and one wire from right lower dentition removed. Description:  After discussing risks, benefits, and alternatives, the patient was brought to the operative suite and placed on the operative table in the supine position.  Anesthesia was induced and the patient was maintained via mask sedation.  The mouth was examined between periods of ventilation and the screws were removed one by one followed by the arch bars.  The wire in the right lower dentition was unwound and cut and removed.  After completion, the mouth was suctioned and he was turned back to anesthesia for wake-up and moved to the recovery room in stable condition.

## 2020-05-17 NOTE — Anesthesia Postprocedure Evaluation (Signed)
Anesthesia Post Note  Patient: Austin [name]  Procedure(s) Performed: MANDIBULAR HARDWARE REMOVAL (N/A Mouth)     Patient location during evaluation: PACU Anesthesia Type: General Level of consciousness: awake and alert Pain management: pain level controlled Vital Signs Assessment: post-procedure vital signs reviewed and stable Respiratory status: spontaneous breathing, nonlabored ventilation, respiratory function stable and patient connected to nasal cannula oxygen Cardiovascular status: blood pressure returned to baseline and stable Postop Assessment: no apparent nausea or vomiting Anesthetic complications: no   No complications documented.  Last Vitals:  Vitals:   05/17/20 1420 05/17/20 1430  BP:  110/72  Pulse: 68 67  Resp: 14 18  Temp:  36.6 C  SpO2: 100% 100%    Last Pain:  Vitals:   05/17/20 1430  TempSrc: Oral  PainSc: 0-No pain                 Kennieth Rad

## 2020-05-17 NOTE — Discharge Instructions (Signed)

## 2020-05-20 ENCOUNTER — Encounter (HOSPITAL_BASED_OUTPATIENT_CLINIC_OR_DEPARTMENT_OTHER): Payer: Self-pay | Admitting: Otolaryngology

## 2020-06-27 DIAGNOSIS — Z1889 Other specified retained foreign body fragments: Secondary | ICD-10-CM | POA: Diagnosis not present

## 2020-06-27 DIAGNOSIS — S02601K Fracture of unspecified part of body of right mandible, subsequent encounter for fracture with nonunion: Secondary | ICD-10-CM | POA: Diagnosis not present

## 2020-08-27 IMAGING — CT CT NECK W/ CM
2 of 3 series · 7 of 14 positions shown, 8 images · IV contrast (iopamidol)
Comparison: No pertinent prior studies available for comparison.

CLINICAL DATA: Mandibular abscess. Cellulitis and abscess of neck.
Mandibular abscess/cellulitis and abscess of neck.

EXAM:
CT NECK WITH CONTRAST
TECHNIQUE: Multidetector CT imaging of the neck was performed using the
standard protocol following the bolus administration of intravenous
contrast.
CONTRAST:  75mL [id number] IOPAMIDOL ([id number]) INJECTION 61%

[Series 3: neck (person_name) · axial · 0.52mm/px · z∈[-357,-219]mm · 3 of 139 slices shown]
[im 35/139  bone]
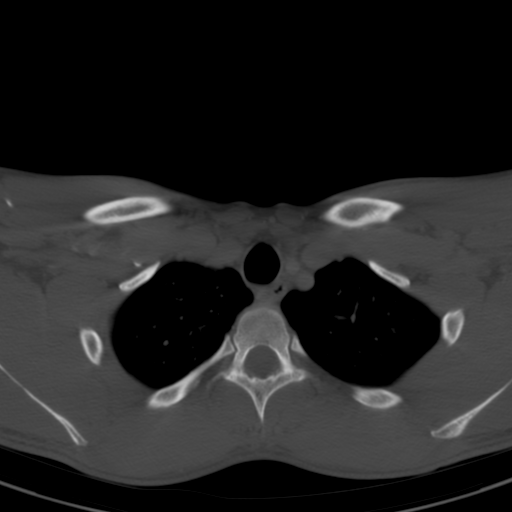
[im 70/139  bone]
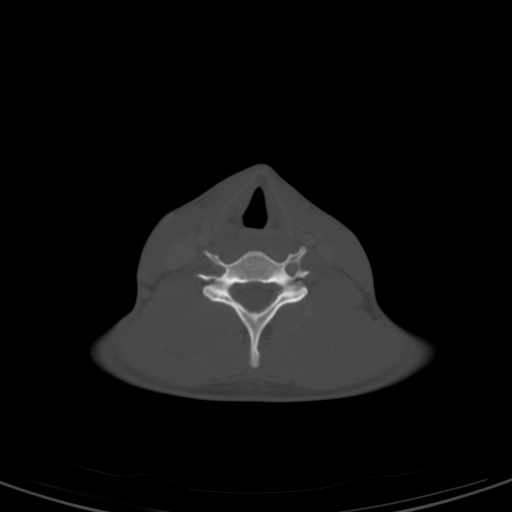
[im 104/139  bone]
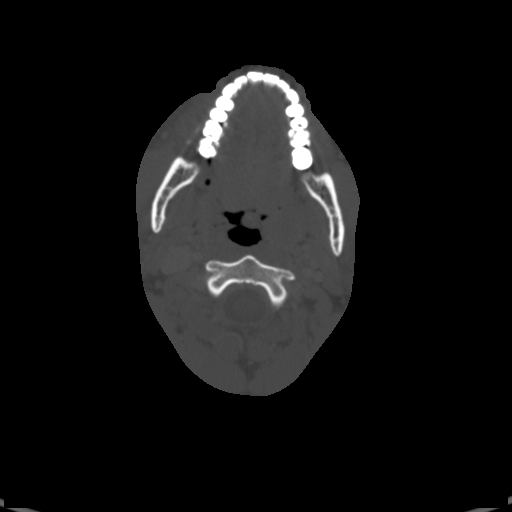

[Series 6: angled axial-oropharynx · axial · 0.49mm/px · z∈[-396,-227]mm · 4 of 146 slices shown, 5 images]
[im 30/146  soft-tissue]
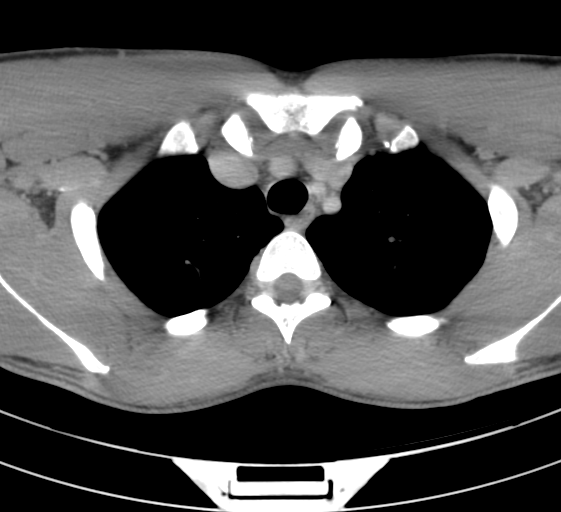
[im 30/146  bone]
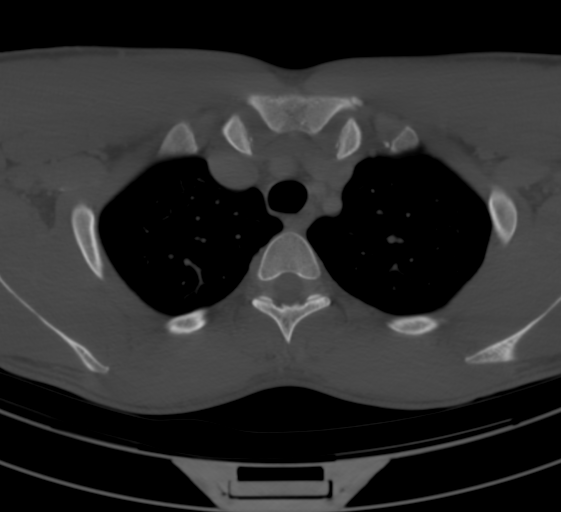
[im 59/146  bone]
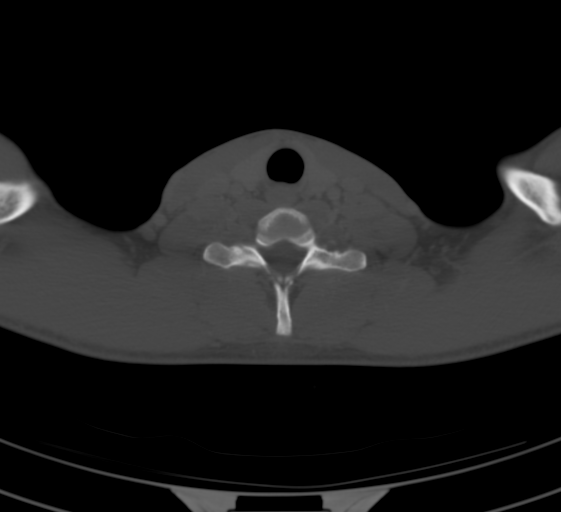
[im 88/146  bone]
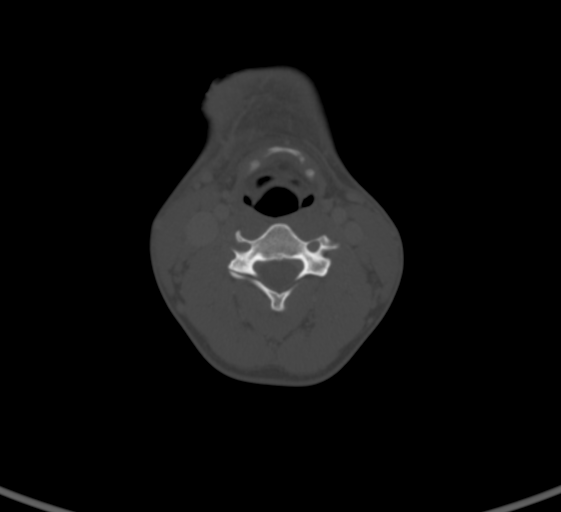
[im 117/146  bone]
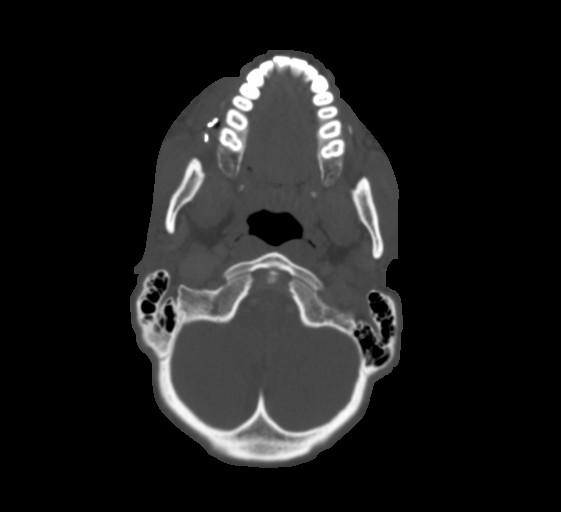

[7 of 14 positions shown; findings below may reference images not displayed]

FINDINGS: Pharynx and larynx: There is no appreciable swelling or discrete
mass within the oral cavity, pharynx or larynx.

Salivary glands: The parotid glands are unremarkable. Inflammatory
stranding surrounds both submandibular glands.

Thyroid: Unremarkable.

Lymph nodes: Bilateral cervical lymphadenopathy, most notably at the
level II and I stations, likely reactive.

Vascular: The major vascular structures of the neck are patent.

Limited intracranial: No abnormality identified.

Visualized orbits: Incompletely imaged. Visualized orbits show no
acute finding.

Mastoids and visualized paranasal sinuses: Trace mucosal thickening
within the right maxillary sinus.

Skeleton: Comminuted fracture of the right mandibular body which
appears acute or early subacute, status post wire fixation.

Upper chest: No consolidation within the imaged lung apices.

Other: Immediately superficial to the right mandibular body
fracture, there is a peripherally enhancing hypodense region
measuring 4.9 x 1.3 x 2.9 cm which may reflect phlegmon or early
abscess (series 3, image 48) (series 5, image 21). More
inferolaterally within the right perimandibular region, there is a
3.4 x 2.1 cm region of induration which appears to predominantly
reflect phlegmon, although there may be small central liquid
components (series 3, image 58) (series 5, image 26). Additionally,
there is a probable small abscess along the inferior aspect of the
right masseter muscle measuring 1.6 cm (series 3, image 50).
Cellulitis changes extend into the upper neck soft tissues.

These results will be called to the ordering clinician or
representative by the Radiologist Assistant, and communication
documented in the PACS or [REDACTED].
IMPRESSION: Comminuted fracture of the right mandibular body which appears acute
or early subacute, status post wire fixation.

Prominent cellulitis changes within the right greater than left
perimandibular regions and extending into the upper neck.

Immediately superficial to the right mandibular body fracture, there
is a peripherally enhancing hypodense region measuring 4.9 x 1.3 x
2.9 cm, which may reflect phlegmon or early abscess. More
inferolaterally within the right perimandibular region, there is a
3.4 x 2.1 cm region of soft tissue induration which appears to
predominantly reflect phlegmon, although there may be small central
liquid components. Additionally, there is a small abscess along the
inferior aspect of the right masseter muscle measuring 1.6 cm.

## 2020-09-16 ENCOUNTER — Other Ambulatory Visit: Payer: Self-pay

## 2020-09-16 ENCOUNTER — Ambulatory Visit
Admission: EM | Admit: 2020-09-16 | Discharge: 2020-09-16 | Disposition: A | Discharge status: Home / Self Care | Payer: BC Managed Care – PPO | Attending: Emergency Medicine | Admitting: Emergency Medicine

## 2020-09-16 DIAGNOSIS — Z1152 Encounter for screening for COVID-19: Secondary | ICD-10-CM | POA: Insufficient documentation

## 2020-09-16 DIAGNOSIS — J069 Acute upper respiratory infection, unspecified: Secondary | ICD-10-CM | POA: Diagnosis not present

## 2020-09-16 DIAGNOSIS — R112 Nausea with vomiting, unspecified: Secondary | ICD-10-CM | POA: Diagnosis not present

## 2020-09-16 LAB — POCT RAPID STREP A (OFFICE): Rapid Strep A Screen: NEGATIVE

## 2020-09-16 MED ORDER — DEXAMETHASONE 4 MG PO TABS: 4.0000 mg | ORAL_TABLET | Freq: Every day | ORAL | 0 refills | Status: AC

## 2020-09-16 MED ORDER — CETIRIZINE HCL 10 MG PO TABS: 10.0000 mg | ORAL_TABLET | Freq: Every day | ORAL | 0 refills | Status: DC

## 2020-09-16 MED ORDER — FLUTICASONE PROPIONATE 50 MCG/ACT NA SUSP
1.0000 | Freq: Every day | NASAL | 0 refills | Status: DC
Start: 2020-09-16 — End: 2023-08-31

## 2020-09-16 MED ORDER — ONDANSETRON 4 MG PO TBDP: 4.0000 mg | ORAL_TABLET | Freq: Three times a day (TID) | ORAL | 0 refills | Status: DC | PRN

## 2020-09-16 MED ORDER — BENZONATATE 100 MG PO CAPS: 100.0000 mg | ORAL_CAPSULE | Freq: Three times a day (TID) | ORAL | 0 refills | Status: DC

## 2020-09-16 NOTE — ED Triage Notes (Signed)
Pt presents with body aches and fever with cough and some vomiting

## 2020-09-16 NOTE — ED Provider Notes (Signed)
[facility]   [id number] 09/16/20 Arrival Time: 1142   CC: COVID symptoms  SUBJECTIVE: History from: patient.  RAYNALDO FALCO is a 19 y.o. male who presents to the urgent care with a complaint of fever, cough, body aches and vomiting for the past few days.  Denies sick exposure to COVID, flu or strep.  Denies recent travel.  Has tried OTC medication without relief.  Denies aggravating or alleviating factors.  Denies previous symptoms in the past.   Denies , fatigue, sinus pain, rhinorrhea, sore throat, SOB, wheezing, chest pain, nausea, changes in bowel or bladder habits.     ROS: As per HPI.  All other pertinent ROS negative.     Past Medical History:  Diagnosis Date  . Complication of anesthesia 03/20/2020   hard to wake    Past Surgical History:  Procedure Laterality Date  . jaw wired    . MANDIBLE SURGERY  03/21/2020   REVISION OF  MANDIBULAR  FIXATION AND IRRIGATION AND DEBRIDEMENT OF NECK ABSCESS (Right )  . MANDIBULAR HARDWARE REMOVAL N/A 05/17/2020   Procedure: MANDIBULAR HARDWARE REMOVAL;  Surgeon: Christia Reading, MD;  Location: [facility];  Service: ENT;  Laterality: N/A;  . ORIF MANDIBULAR FRACTURE Right 04/10/2020   Procedure: OPEN REDUCTION INTERNAL FIXATION (ORIF) MANDIBULAR FRACTURE;  Surgeon: Christia Reading, MD;  Location: [facility] OR;  Service: ENT;  Laterality: Right;  . ORIF MANDIBULAR FRACTURE Right 03/20/2020   Procedure: REVISION OF  MANDIBULAR  FIXATION AND IRRIGATION AND DEBRIDEMENT OF NECK ABSCESS;  Surgeon: Christia Reading, MD;  Location: [facility] OR;  Service: ENT;  Laterality: Right;  . WISDOM TOOTH EXTRACTION     No Known Allergies No current facility-administered medications on file prior to encounter.   No current outpatient medications on file prior to encounter.   Social History   Socioeconomic History  . Marital status: Single    Spouse name: Not on file  . Number of children: Not on file  . Years of education: Not on file  .  Highest education level: Not on file  Occupational History  . Not on file  Tobacco Use  . Smoking status: Never Smoker  . Smokeless tobacco: Never Used  Vaping Use  . Vaping Use: Never used  Substance and Sexual Activity  . Alcohol use: Never  . Drug use: Never  . Sexual activity: Not on file  Other Topics Concern  . Not on file  Social History Narrative  . Not on file   Social Determinants of Health   Financial Resource Strain:   . Difficulty of Paying Living Expenses: Not on file  Food Insecurity:   . Worried About Programme researcher, broadcasting/film/video in the Last Year: Not on file  . Ran Out of Food in the Last Year: Not on file  Transportation Needs:   . Lack of Transportation (Medical): Not on file  . Lack of Transportation (Non-Medical): Not on file  Physical Activity:   . Days of Exercise per Week: Not on file  . Minutes of Exercise per Session: Not on file  Stress:   . Feeling of Stress : Not on file  Social Connections:   . Frequency of Communication with Friends and Family: Not on file  . Frequency of Social Gatherings with Friends and Family: Not on file  . Attends Religious Services: Not on file  . Active Member of Clubs or Organizations: Not on file  . Attends Banker Meetings: Not on file  .  Marital Status: Not on file  Intimate Partner Violence:   . Fear of Current or Ex-Partner: Not on file  . Emotionally Abused: Not on file  . Physically Abused: Not on file  . Sexually Abused: Not on file   History reviewed. No pertinent family history.  OBJECTIVE:  Vitals:   09/16/20 1306  BP: (!) 141/70  Pulse: (!) 107  Resp: 20  Temp: (!) 101.5 F (38.6 C)  SpO2: 97%     General appearance: alert; appears fatigued, but nontoxic; speaking in full sentences and tolerating own secretions HEENT: NCAT; Ears: EACs clear, TMs pearly gray; Eyes: PERRL.  EOM grossly intact. Sinuses: nontender; Nose: nares patent without rhinorrhea, Throat: oropharynx clear, tonsils  non erythematous or enlarged, uvula midline  Neck: supple without LAD Lungs: unlabored respirations, symmetrical air entry; cough: moderate; no respiratory distress; CTAB Heart: regular rate and rhythm.  Radial pulses 2+ symmetrical bilaterally Skin: warm and dry Psychological: alert and cooperative; normal mood and affect  LABS:  Results for orders placed or performed during the hospital encounter of 09/16/20 (from the past 24 hour(s))  POCT rapid strep A     Status: None   Collection Time: 09/16/20  1:15 PM  Result Value Ref Range   Rapid Strep A Screen Negative Negative     ASSESSMENT & PLAN:  1. Encounter for screening for COVID-19   2. URI with cough and congestion   3. Non-intractable vomiting with nausea, unspecified vomiting type     Meds ordered this encounter  Medications  . fluticasone (FLONASE) 50 MCG/ACT nasal spray    Sig: Place 1 spray into both nostrils daily for 14 days.    Dispense:  16 g    Refill:  0  . cetirizine (ZYRTEC ALLERGY) 10 MG tablet    Sig: Take 1 tablet (10 mg total) by mouth daily.    Dispense:  30 tablet    Refill:  0  . benzonatate (TESSALON) 100 MG capsule    Sig: Take 1 capsule (100 mg total) by mouth every 8 (eight) hours.    Dispense:  30 capsule    Refill:  0  . dexamethasone (DECADRON) 4 MG tablet    Sig: Take 1 tablet (4 mg total) by mouth daily for 7 days.    Dispense:  7 tablet    Refill:  0  . ondansetron (ZOFRAN ODT) 4 MG disintegrating tablet    Sig: Take 1 tablet (4 mg total) by mouth every 8 (eight) hours as needed for nausea or vomiting.    Dispense:  20 tablet    Refill:  0    Discharge instructions.    COVID-19, RSV, flu A/B testing ordered.  It will take between 2-7 days for test results.  Someone will contact you regarding abnormal results.    In the meantime: You should remain isolated in your home for 10 days from symptom onset AND greater than 24 hours after symptoms resolution (absence of fever without the  use of fever-reducing medication and improvement in respiratory symptoms), whichever is longer Get plenty of rest and push fluids Tessalon Perles prescribed for cough Zyrtec for nasal congestion, runny nose, and/or sore throat Flonase for nasal congestion and runny nose Decadron was prescribed Zofran was prescribed Use medications daily for symptom relief Use OTC medications like ibuprofen or tylenol as needed fever or pain Call or go to the ED if you have any new or worsening symptoms such as fever, worsening cough, shortness of breath, chest  tightness, chest pain, turning blue, changes in mental status, etc...   Reviewed expectations re: course of current medical issues. Questions answered. Outlined signs and symptoms indicating need for more acute intervention. Patient verbalized understanding. After Visit Summary given.         Durward Parcel, FNP 09/16/20 1414

## 2020-09-16 NOTE — Discharge Instructions (Addendum)
COVID-19, RSV, flu A/B testing ordered.  It will take between 2-7 days for test results.  Someone will contact you regarding abnormal results.    In the meantime: You should remain isolated in your home for 10 days from symptom onset AND greater than 24 hours after symptoms resolution (absence of fever without the use of fever-reducing medication and improvement in respiratory symptoms), whichever is longer Get plenty of rest and push fluids Tessalon Perles prescribed for cough Zyrtec for nasal congestion, runny nose, and/or sore throat Flonase for nasal congestion and runny nose Decadron was prescribed Zofran was prescribed Use medications daily for symptom relief Use OTC medications like ibuprofen or tylenol as needed fever or pain Call or go to the ED if you have any new or worsening symptoms such as fever, worsening cough, shortness of breath, chest tightness, chest pain, turning blue, changes in mental status, etc..Marland Kitchen

## 2020-09-18 LAB — COVID-19, FLU A+B AND RSV
Influenza A, NAA: DETECTED — AB
Influenza B, NAA: NOT DETECTED
RSV, NAA: NOT DETECTED
SARS-CoV-2, NAA: NOT DETECTED

## 2020-09-19 LAB — CULTURE, GROUP A STREP (THRC)

## 2020-09-24 DIAGNOSIS — Z131 Encounter for screening for diabetes mellitus: Secondary | ICD-10-CM | POA: Diagnosis not present

## 2020-09-24 DIAGNOSIS — Z13220 Encounter for screening for lipoid disorders: Secondary | ICD-10-CM | POA: Diagnosis not present

## 2020-09-24 DIAGNOSIS — Z0000 Encounter for general adult medical examination without abnormal findings: Secondary | ICD-10-CM | POA: Diagnosis not present

## 2021-01-13 DIAGNOSIS — Z23 Encounter for immunization: Secondary | ICD-10-CM | POA: Diagnosis not present

## 2022-06-16 DIAGNOSIS — L600 Ingrowing nail: Secondary | ICD-10-CM | POA: Diagnosis not present

## 2022-06-26 DIAGNOSIS — Z20822 Contact with and (suspected) exposure to covid-19: Secondary | ICD-10-CM | POA: Diagnosis not present

## 2022-06-26 DIAGNOSIS — J029 Acute pharyngitis, unspecified: Secondary | ICD-10-CM | POA: Diagnosis not present

## 2022-06-26 DIAGNOSIS — R509 Fever, unspecified: Secondary | ICD-10-CM | POA: Diagnosis not present

## 2022-06-26 DIAGNOSIS — R21 Rash and other nonspecific skin eruption: Secondary | ICD-10-CM | POA: Diagnosis not present

## 2022-12-28 DIAGNOSIS — R221 Localized swelling, mass and lump, neck: Secondary | ICD-10-CM | POA: Diagnosis not present

## 2022-12-28 DIAGNOSIS — L309 Dermatitis, unspecified: Secondary | ICD-10-CM | POA: Diagnosis not present

## 2022-12-28 DIAGNOSIS — Z0000 Encounter for general adult medical examination without abnormal findings: Secondary | ICD-10-CM | POA: Diagnosis not present

## 2022-12-28 DIAGNOSIS — Z13220 Encounter for screening for lipoid disorders: Secondary | ICD-10-CM | POA: Diagnosis not present

## 2023-01-28 ENCOUNTER — Other Ambulatory Visit (HOSPITAL_COMMUNITY): Payer: Self-pay | Admitting: Otolaryngology

## 2023-01-28 ENCOUNTER — Ambulatory Visit (HOSPITAL_COMMUNITY)
Admission: RE | Admit: 2023-01-28 | Discharge: 2023-01-28 | Disposition: A | Discharge status: Home / Self Care | Payer: BC Managed Care – PPO | Source: Ambulatory Visit | Attending: Otolaryngology | Admitting: Otolaryngology

## 2023-01-28 ENCOUNTER — Encounter: Payer: Self-pay | Admitting: Otolaryngology

## 2023-01-28 DIAGNOSIS — M7989 Other specified soft tissue disorders: Secondary | ICD-10-CM | POA: Diagnosis not present

## 2023-01-28 DIAGNOSIS — M272 Inflammatory conditions of jaws: Secondary | ICD-10-CM | POA: Insufficient documentation

## 2023-01-29 DIAGNOSIS — Z9889 Other specified postprocedural states: Secondary | ICD-10-CM | POA: Diagnosis not present

## 2023-01-29 DIAGNOSIS — K122 Cellulitis and abscess of mouth: Secondary | ICD-10-CM | POA: Diagnosis not present

## 2023-05-20 DIAGNOSIS — M545 Low back pain, unspecified: Secondary | ICD-10-CM | POA: Diagnosis not present

## 2023-07-17 DIAGNOSIS — F902 Attention-deficit hyperactivity disorder, combined type: Secondary | ICD-10-CM | POA: Diagnosis not present

## 2023-07-17 DIAGNOSIS — F411 Generalized anxiety disorder: Secondary | ICD-10-CM | POA: Diagnosis not present

## 2023-07-31 DIAGNOSIS — F902 Attention-deficit hyperactivity disorder, combined type: Secondary | ICD-10-CM | POA: Diagnosis not present

## 2023-07-31 DIAGNOSIS — F411 Generalized anxiety disorder: Secondary | ICD-10-CM | POA: Diagnosis not present

## 2023-08-03 DIAGNOSIS — M272 Inflammatory conditions of jaws: Secondary | ICD-10-CM | POA: Diagnosis not present

## 2023-08-05 DIAGNOSIS — S02601A Fracture of unspecified part of body of right mandible, initial encounter for closed fracture: Secondary | ICD-10-CM | POA: Diagnosis not present

## 2023-08-14 DIAGNOSIS — F902 Attention-deficit hyperactivity disorder, combined type: Secondary | ICD-10-CM | POA: Diagnosis not present

## 2023-08-14 DIAGNOSIS — F411 Generalized anxiety disorder: Secondary | ICD-10-CM | POA: Diagnosis not present

## 2023-08-30 ENCOUNTER — Other Ambulatory Visit: Payer: Self-pay | Admitting: Otolaryngology

## 2023-08-31 ENCOUNTER — Other Ambulatory Visit: Payer: Self-pay

## 2023-08-31 ENCOUNTER — Encounter (HOSPITAL_BASED_OUTPATIENT_CLINIC_OR_DEPARTMENT_OTHER): Payer: Self-pay | Admitting: Otolaryngology

## 2023-09-07 ENCOUNTER — Encounter (HOSPITAL_BASED_OUTPATIENT_CLINIC_OR_DEPARTMENT_OTHER)
Admission: RE | Disposition: A | Discharge status: Home / Self Care | Payer: Self-pay | Source: Home / Self Care | Attending: Otolaryngology

## 2023-09-07 ENCOUNTER — Ambulatory Visit (HOSPITAL_BASED_OUTPATIENT_CLINIC_OR_DEPARTMENT_OTHER): Payer: BC Managed Care – PPO

## 2023-09-07 ENCOUNTER — Ambulatory Visit (HOSPITAL_BASED_OUTPATIENT_CLINIC_OR_DEPARTMENT_OTHER): Payer: BC Managed Care – PPO | Admitting: Anesthesiology

## 2023-09-07 ENCOUNTER — Ambulatory Visit (HOSPITAL_BASED_OUTPATIENT_CLINIC_OR_DEPARTMENT_OTHER)
Admission: RE | Admit: 2023-09-07 | Discharge: 2023-09-07 | Disposition: A | Discharge status: Home / Self Care | Payer: BC Managed Care – PPO | Attending: Otolaryngology | Admitting: Otolaryngology

## 2023-09-07 ENCOUNTER — Other Ambulatory Visit: Payer: Self-pay

## 2023-09-07 ENCOUNTER — Encounter (HOSPITAL_BASED_OUTPATIENT_CLINIC_OR_DEPARTMENT_OTHER): Payer: Self-pay | Admitting: Otolaryngology

## 2023-09-07 DIAGNOSIS — M272 Inflammatory conditions of jaws: Secondary | ICD-10-CM | POA: Diagnosis not present

## 2023-09-07 DIAGNOSIS — Z8781 Personal history of (healed) traumatic fracture: Secondary | ICD-10-CM | POA: Insufficient documentation

## 2023-09-07 DIAGNOSIS — M2518 Fistula, other specified site: Secondary | ICD-10-CM | POA: Insufficient documentation

## 2023-09-07 DIAGNOSIS — M278 Other specified diseases of jaws: Secondary | ICD-10-CM | POA: Diagnosis not present

## 2023-09-07 DIAGNOSIS — F909 Attention-deficit hyperactivity disorder, unspecified type: Secondary | ICD-10-CM | POA: Diagnosis not present

## 2023-09-07 DIAGNOSIS — Z9889 Other specified postprocedural states: Secondary | ICD-10-CM | POA: Diagnosis not present

## 2023-09-07 HISTORY — DX: Attention-deficit hyperactivity disorder, unspecified type: F90.9

## 2023-09-07 HISTORY — PX: MANDIBULAR HARDWARE REMOVAL: SHX5205

## 2023-09-07 SURGERY — REMOVAL, HARDWARE, MANDIBLE
Anesthesia: General | Site: Neck | Laterality: Right

## 2023-09-07 MED ORDER — MIDAZOLAM HCL 5 MG/5ML IJ SOLN
INTRAMUSCULAR | Status: DC | PRN
  Administered 2023-09-07: 2 mg via INTRAVENOUS

## 2023-09-07 MED ORDER — PROPOFOL 10 MG/ML IV BOLUS
INTRAVENOUS | Status: DC | PRN
  Administered 2023-09-07: 250 mg via INTRAVENOUS

## 2023-09-07 MED ORDER — BACITRACIN-NEOMYCIN-POLYMYXIN OINTMENT TUBE
TOPICAL_OINTMENT | CUTANEOUS | Status: AC
  Filled 2023-09-07: qty 14.17

## 2023-09-07 MED ORDER — MIDAZOLAM HCL 2 MG/2ML IJ SOLN
INTRAMUSCULAR | Status: AC
  Filled 2023-09-07: qty 2

## 2023-09-07 MED ORDER — CEFAZOLIN SODIUM-DEXTROSE 2-4 GM/100ML-% IV SOLN
INTRAVENOUS | Status: AC
  Filled 2023-09-07: qty 100

## 2023-09-07 MED ORDER — LIDOCAINE-EPINEPHRINE 1 %-1:100000 IJ SOLN
INTRAMUSCULAR | Status: AC
  Filled 2023-09-07: qty 1

## 2023-09-07 MED ORDER — TRIPLE ANTIBIOTIC 3.5-400-5000 EX OINT
TOPICAL_OINTMENT | CUTANEOUS | Status: DC | PRN
  Administered 2023-09-07: 1 via TOPICAL

## 2023-09-07 MED ORDER — SUCCINYLCHOLINE CHLORIDE 200 MG/10ML IV SOSY
PREFILLED_SYRINGE | INTRAVENOUS | Status: DC | PRN
  Administered 2023-09-07: 140 mg via INTRAVENOUS

## 2023-09-07 MED ORDER — SODIUM CHLORIDE 0.9 % IV SOLN: INTRAVENOUS | Status: DC | PRN

## 2023-09-07 MED ORDER — LACTATED RINGERS IV SOLN: INTRAVENOUS | Status: DC

## 2023-09-07 MED ORDER — ONDANSETRON HCL 4 MG/2ML IJ SOLN: 4.0000 mg | Freq: Four times a day (QID) | INTRAMUSCULAR | Status: DC | PRN

## 2023-09-07 MED ORDER — FENTANYL CITRATE (PF) 100 MCG/2ML IJ SOLN
INTRAMUSCULAR | Status: AC
  Filled 2023-09-07: qty 2

## 2023-09-07 MED ORDER — PROPOFOL 10 MG/ML IV BOLUS
INTRAVENOUS | Status: AC
  Filled 2023-09-07: qty 20

## 2023-09-07 MED ORDER — OXYCODONE HCL 5 MG PO TABS: 5.0000 mg | ORAL_TABLET | Freq: Once | ORAL | Status: DC | PRN

## 2023-09-07 MED ORDER — DEXAMETHASONE SODIUM PHOSPHATE 4 MG/ML IJ SOLN
INTRAMUSCULAR | Status: DC | PRN
  Administered 2023-09-07: 10 mg via INTRAVENOUS

## 2023-09-07 MED ORDER — FENTANYL CITRATE (PF) 100 MCG/2ML IJ SOLN
25.0000 ug | INTRAMUSCULAR | Status: DC | PRN
Start: 2023-09-07 — End: 2023-09-07

## 2023-09-07 MED ORDER — OXYCODONE HCL 5 MG/5ML PO SOLN: 5.0000 mg | Freq: Once | ORAL | Status: DC | PRN

## 2023-09-07 MED ORDER — LIDOCAINE 2% (20 MG/ML) 5 ML SYRINGE
INTRAMUSCULAR | Status: DC | PRN
  Administered 2023-09-07: 100 mg via INTRAVENOUS

## 2023-09-07 MED ORDER — LIDOCAINE 2% (20 MG/ML) 5 ML SYRINGE
INTRAMUSCULAR | Status: AC
  Filled 2023-09-07: qty 5

## 2023-09-07 MED ORDER — ONDANSETRON HCL 4 MG/2ML IJ SOLN
INTRAMUSCULAR | Status: DC | PRN
  Administered 2023-09-07: 4 mg via INTRAVENOUS

## 2023-09-07 MED ORDER — DEXAMETHASONE SODIUM PHOSPHATE 10 MG/ML IJ SOLN
INTRAMUSCULAR | Status: AC
  Filled 2023-09-07: qty 1

## 2023-09-07 MED ORDER — CLINDAMYCIN HCL 300 MG PO CAPS: 300.0000 mg | ORAL_CAPSULE | Freq: Three times a day (TID) | ORAL | 0 refills | Status: AC

## 2023-09-07 MED ORDER — CEFAZOLIN SODIUM-DEXTROSE 2-4 GM/100ML-% IV SOLN
2.0000 g | INTRAVENOUS | Status: AC
  Administered 2023-09-07: 2 g via INTRAVENOUS

## 2023-09-07 MED ORDER — 0.9 % SODIUM CHLORIDE (POUR BTL) OPTIME
TOPICAL | Status: DC | PRN
Start: 2023-09-07 — End: 2023-09-07
  Administered 2023-09-07: 200 mL

## 2023-09-07 MED ORDER — FENTANYL CITRATE (PF) 100 MCG/2ML IJ SOLN
INTRAMUSCULAR | Status: DC | PRN
  Administered 2023-09-07 (×3): 50 ug via INTRAVENOUS

## 2023-09-07 MED ORDER — LIDOCAINE-EPINEPHRINE 1 %-1:100000 IJ SOLN
INTRAMUSCULAR | Status: DC | PRN
  Administered 2023-09-07: 1.5 mL

## 2023-09-07 MED ORDER — ONDANSETRON HCL 4 MG/2ML IJ SOLN
INTRAMUSCULAR | Status: AC
  Filled 2023-09-07: qty 2

## 2023-09-07 MED ORDER — DEXMEDETOMIDINE HCL IN NACL 80 MCG/20ML IV SOLN
INTRAVENOUS | Status: DC | PRN
  Administered 2023-09-07: 12 ug via INTRAVENOUS
  Administered 2023-09-07: 8 ug via INTRAVENOUS

## 2023-09-07 SURGICAL SUPPLY — 45 items
BLADE SURG 15 STRL LF DISP TIS (BLADE) ×1 IMPLANT
BLADE SURG 15 STRL SS (BLADE) ×1
CANISTER SUCT 1200ML W/VALVE (MISCELLANEOUS) ×1 IMPLANT
CORD BIPOLAR FORCEPS 12FT (ELECTRODE) IMPLANT
COVER BACK TABLE 60X90IN (DRAPES) IMPLANT
COVER MAYO STAND STRL (DRAPES) ×1 IMPLANT
DERMABOND ADVANCED .7 DNX12 (GAUZE/BANDAGES/DRESSINGS) IMPLANT
DRAPE OEC MINIVIEW 54X84 (DRAPES) IMPLANT
DRAPE U-SHAPE 76X120 STRL (DRAPES) ×1 IMPLANT
ELECT COATED BLADE 2.86 ST (ELECTRODE) ×1 IMPLANT
ELECT PAIRED SUBDERMAL (MISCELLANEOUS) ×1
ELECT REM PT RETURN 9FT ADLT (ELECTROSURGICAL) ×1
ELECTRODE PAIRED SUBDERMAL (MISCELLANEOUS) IMPLANT
ELECTRODE REM PT RTRN 9FT ADLT (ELECTROSURGICAL) ×1 IMPLANT
FORCEPS BIPOLAR SPETZLER 8 1.0 (NEUROSURGERY SUPPLIES) IMPLANT
GAUZE 4X4 16PLY ~~LOC~~+RFID DBL (SPONGE) IMPLANT
GAUZE SPONGE 4X4 12PLY STRL LF (GAUZE/BANDAGES/DRESSINGS) ×2 IMPLANT
GLOVE BIO SURGEON STRL SZ7 (GLOVE) IMPLANT
GLOVE BIO SURGEON STRL SZ7.5 (GLOVE) ×1 IMPLANT
GLOVE BIOGEL PI IND STRL 7.5 (GLOVE) IMPLANT
NDL BLUNT 17GA (NEEDLE) IMPLANT
NDL PRECISIONGLIDE 27X1.5 (NEEDLE) ×1 IMPLANT
NEEDLE BLUNT 17GA (NEEDLE) ×1 IMPLANT
NEEDLE PRECISIONGLIDE 27X1.5 (NEEDLE) ×1 IMPLANT
NS IRRIG 1000ML POUR BTL (IV SOLUTION) IMPLANT
PACK BASIN DAY SURGERY FS (CUSTOM PROCEDURE TRAY) ×1 IMPLANT
PENCIL SMOKE EVACUATOR (MISCELLANEOUS) ×1 IMPLANT
PROBE NERVBE PRASS .33 (MISCELLANEOUS) IMPLANT
SCISSORS WIRE ANG 4 3/4 DISP (INSTRUMENTS) IMPLANT
SET WALTER ACTIVATION W/DRAPE (SET/KITS/TRAYS/PACK) IMPLANT
SHEET MEDIUM DRAPE 40X70 STRL (DRAPES) IMPLANT
SLEEVE SCD COMPRESS KNEE MED (STOCKING) ×1 IMPLANT
SPIKE FLUID TRANSFER (MISCELLANEOUS) ×1 IMPLANT
SUT MNCRL AB 3-0 PS2 18 (SUTURE) IMPLANT
SUT NYLON ETHILON 5-0 P-3 1X18 (SUTURE) IMPLANT
SUT SILK 3 0 REEL (SUTURE) IMPLANT
SUT VIC AB 3-0 SH 27X BRD (SUTURE) IMPLANT
SUT VIC AB 4-0 PS2 27 (SUTURE) IMPLANT
SYR 20ML LL LF (SYRINGE) IMPLANT
SYR BULB EAR ULCER 3OZ GRN STR (SYRINGE) ×1 IMPLANT
SYR CONTROL 10ML LL (SYRINGE) ×1 IMPLANT
TOWEL GREEN STERILE FF (TOWEL DISPOSABLE) ×2 IMPLANT
TRAY DSU PREP LF (CUSTOM PROCEDURE TRAY) IMPLANT
TUBE CONNECTING 20X1/4 (TUBING) ×1 IMPLANT
YANKAUER SUCT BULB TIP NO VENT (SUCTIONS) ×1 IMPLANT

## 2023-09-07 NOTE — Op Note (Signed)
Preop diagnosis: Right perimandibular fistula Postop diagnosis: same Procedure: Removal of right mandible hardware Surgeon: Jenne Pane Assist: None Anesth: General and local with 1% lidocaine with 1:100,000 epinephrine Compl: None Findings: Fistula track followed up to mandible where plate was encountered.  Screw immediately encountered was removed as was the screw posterior to that position.  Neither screw was observed to be loose. Description:  After discussing risks, benefits, and alternatives, the patient was brought to the operative suite and placed on the operative table in the supine position.  Anesthesia was induced and the patient was intubated by the Anesthesia team without difficulty.  The eyes were taped closed.  The NIM monitor was placed in the right lower lip and turned on for the case.  The area around the fistula was injected with local anesthetic.  The right neck/face was prepped and draped in sterile fashion.  The skin to either side of the fistula was incised slightly using a 15 blade.  Dissection was then performed entirely bluntly to the mandible where the plate was encountered.  Soft tissues were elevated from the screw surface with some difficulty until the screw could be engaged with the screwdriver and backed out and removed.  In an effort to identify the position of this screw on the plate, an effort was made with a C-arm but visualization was not achieved well.  Dissection thus was performed posteriorly from the first screw site and a screw was identified posteriorly.  Here also, soft tissues were lifted enough to engage the screw driver and the screw was backed out and removed.  The wound was copiously irrigated with saline.  The subcutaneous tissue was closed with 4-0 Vicryl and the skin was closed with 5-0 Nylon.  Drapes were removed and the patient was cleaned off.  Triple antibiotic ointment was added to the incision site.  He was returned to Anesthesia for wake-up.  He was  extubated and moved to the recovery room in stable condition.

## 2023-09-07 NOTE — H&P (Signed)
Austin [name] is an 22 y.o. male.   Chief Complaint: Right perimandibular fistula HPI: 22 year old male who underwent ORIF of right mandible fracture in 2021 with good results.  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  He presents for surgical exploration.  Past Medical History:  Diagnosis Date   ADHD (attention deficit hyperactivity disorder)    Complication of anesthesia 03/20/2020   hard to wake/ combative    Past Surgical History:  Procedure Laterality Date   jaw wired     MANDIBLE SURGERY  03/21/2020   REVISION OF  MANDIBULAR  FIXATION AND IRRIGATION AND DEBRIDEMENT OF NECK ABSCESS (Right )   MANDIBULAR HARDWARE REMOVAL N/A 05/17/2020   Procedure: MANDIBULAR HARDWARE REMOVAL;  Surgeon: Christia Reading, MD;  Location: [facility];  Service: ENT;  Laterality: N/A;   ORIF MANDIBULAR FRACTURE Right 04/10/2020   Procedure: OPEN REDUCTION INTERNAL FIXATION (ORIF) MANDIBULAR FRACTURE;  Surgeon: Christia Reading, MD;  Location: [facility] OR;  Service: ENT;  Laterality: Right;   ORIF MANDIBULAR FRACTURE Right 03/20/2020   Procedure: REVISION OF  MANDIBULAR  FIXATION AND IRRIGATION AND DEBRIDEMENT OF NECK ABSCESS;  Surgeon: Christia Reading, MD;  Location: [facility] OR;  Service: ENT;  Laterality: Right;   WISDOM TOOTH EXTRACTION      History reviewed. No pertinent family history. Social History:  reports that he has never smoked. He has never used smokeless tobacco. He reports current alcohol use. He reports that he does not use drugs.  Allergies: No Known Allergies  Medications Prior to Admission  Medication Sig Dispense Refill   amphetamine-dextroamphetamine (ADDERALL) 15 MG tablet Take 15 mg by mouth daily.      No results found for this or any previous visit (from the past 48 hour(s)). No results found.  Review of Systems  All other systems reviewed and are negative.   Blood pressure 126/77, pulse 64, temperature 98.7  F (37.1 C), temperature source Temporal, resp. rate 16, height 6' (1.829 m), weight 88.4 kg, SpO2 100%. Physical Exam Constitutional:      Appearance: Normal appearance. He is normal weight.  HENT:     Head: Normocephalic and atraumatic.     Right Ear: External ear normal.     Left Ear: External ear normal.     Nose: Nose normal.     Mouth/Throat:     Mouth: Mucous membranes are moist.     Pharynx: Oropharynx is clear.  Eyes:     Extraocular Movements: Extraocular movements intact.     Conjunctiva/sclera: Conjunctivae normal.     Pupils: Pupils are equal, round, and reactive to light.  Neck:     Comments: Right angle of mandible fistula site. Cardiovascular:     Rate and Rhythm: Normal rate.  Pulmonary:     Effort: Pulmonary effort is normal.  Skin:    General: Skin is warm and dry.  Neurological:     General: No focal deficit present.     Mental Status: He is alert and oriented to person, place, and time.  Psychiatric:        Mood and Affect: Mood normal.        Behavior: Behavior normal.        Thought Content: Thought content normal.        Judgment: Judgment normal.      Assessment/Plan Right angle of mandible fistula  To OR for surgical exploration of mandible via neck  fistula and likely removal of screws.  We agreed to try a conservative effort first and avoid trying to remove the entire plate and hardware due to high risk to the marginal mandibular nerve.  Christia Reading, MD 09/07/2023, 1:34 PM

## 2023-09-07 NOTE — Discharge Instructions (Signed)

## 2023-09-07 NOTE — Anesthesia Preprocedure Evaluation (Signed)
Anesthesia Evaluation  Patient identified by MRN, date of birth, ID band Patient awake    Reviewed: Allergy & Precautions, H&P , NPO status , Patient's Chart, lab work & pertinent test results  Airway Mallampati: II   Neck ROM: full    Dental   Pulmonary neg pulmonary ROS   breath sounds clear to auscultation       Cardiovascular negative cardio ROS  Rhythm:regular Rate:Normal     Neuro/Psych        ADHD   GI/Hepatic   Endo/Other    Renal/GU      Musculoskeletal   Abdominal   Peds  Hematology   Anesthesia Other Findings   Reproductive/Obstetrics                             Anesthesia Physical Anesthesia Plan  ASA: 2  Anesthesia Plan: General   Post-op Pain Management:    Induction: Intravenous  PONV Risk Score and Plan: 2 and Ondansetron, Dexamethasone, Midazolam and Treatment may vary due to age or medical condition  Airway Management Planned: Oral ETT  Additional Equipment:   Intra-op Plan:   Post-operative Plan: Extubation in OR  Informed Consent: I have reviewed the patients History and Physical, chart, labs and discussed the procedure including the risks, benefits and alternatives for the proposed anesthesia with the patient or authorized representative who has indicated his/her understanding and acceptance.     Dental advisory given  Plan Discussed with: CRNA, Anesthesiologist and Surgeon  Anesthesia Plan Comments:        Anesthesia Quick Evaluation

## 2023-09-07 NOTE — Brief Op Note (Signed)
09/07/2023  3:03 PM  PATIENT:  Austin [name]  22 y.o. male  PRE-OPERATIVE DIAGNOSIS:  Mandibular abscess; History of mandibular surgery  POST-OPERATIVE DIAGNOSIS:  Mandibular abscess; History of mandibular surgery  PROCEDURE:  Procedure(s): MANDIBULAR HARDWARE REMOVAL; APPROACH VIA NECK (Right)  SURGEON:  Surgeons and Role:    Christia Reading, MD - Primary  PHYSICIAN ASSISTANT:   ASSISTANTS: none   ANESTHESIA:   general  EBL:  Minimal   BLOOD ADMINISTERED:none  DRAINS: none   LOCAL MEDICATIONS USED:  LIDOCAINE   SPECIMEN:  No Specimen  DISPOSITION OF SPECIMEN:  N/A  COUNTS:  YES  TOURNIQUET:  * No tourniquets in log *  DICTATION: .Note written in EPIC  PLAN OF CARE: Discharge to home after PACU  PATIENT DISPOSITION:  PACU - hemodynamically stable.   Delay start of Pharmacological VTE agent (>24hrs) due to surgical blood loss or risk of bleeding: no

## 2023-09-07 NOTE — Anesthesia Procedure Notes (Signed)
Procedure Name: Intubation Date/Time: 09/07/2023 1:51 PM  Performed by: Burna Cash, CRNAPre-anesthesia Checklist: Patient identified, Emergency Drugs available, Suction available and Patient being monitored Patient Re-evaluated:Patient Re-evaluated prior to induction Oxygen Delivery Method: Circle system utilized Preoxygenation: Pre-oxygenation with 100% oxygen Induction Type: IV induction Ventilation: Mask ventilation without difficulty Laryngoscope Size: Mac and 4 Grade View: Grade I Tube type: Oral Tube size: 7.0 mm Number of attempts: 1 Airway Equipment and Method: Stylet and Oral airway Placement Confirmation: ETT inserted through vocal cords under direct vision, positive ETCO2 and breath sounds checked- equal and bilateral Secured at: 22 cm Tube secured with: Tape Dental Injury: Teeth and Oropharynx as per pre-operative assessment

## 2023-09-07 NOTE — Transfer of Care (Signed)
Immediate Anesthesia Transfer of Care Note  Patient: Austin [name]  Procedure(s) Performed: MANDIBULAR HARDWARE REMOVAL; APPROACH VIA NECK (Right: Neck)  Patient Location: PACU  Anesthesia Type:General  Level of Consciousness: sedated  Airway & Oxygen Therapy: Patient Spontanous Breathing and Patient connected to face mask oxygen  Post-op Assessment: Report given to RN and Post -op Vital signs reviewed and stable  Post vital signs: Reviewed and stable  Last Vitals:  Vitals Value Taken Time  BP 127/60 09/07/23 1520  Temp    Pulse 84 09/07/23 1523  Resp 14 09/07/23 1523  SpO2 99 % 09/07/23 1523  Vitals shown include unfiled device data.  Last Pain:  Vitals:   09/07/23 1149  TempSrc: Temporal  PainSc: 0-No pain         Complications: No notable events documented.

## 2023-09-08 ENCOUNTER — Encounter (HOSPITAL_BASED_OUTPATIENT_CLINIC_OR_DEPARTMENT_OTHER): Payer: Self-pay | Admitting: Otolaryngology

## 2023-09-08 NOTE — Anesthesia Postprocedure Evaluation (Signed)
Anesthesia Post Note  Patient: Austin [name]  Procedure(s) Performed: MANDIBULAR HARDWARE REMOVAL; APPROACH VIA NECK (Right: Neck)     Patient location during evaluation: PACU Anesthesia Type: General Level of consciousness: awake and alert Pain management: pain level controlled Vital Signs Assessment: post-procedure vital signs reviewed and stable Respiratory status: spontaneous breathing, nonlabored ventilation, respiratory function stable and patient connected to nasal cannula oxygen Cardiovascular status: blood pressure returned to baseline and stable Postop Assessment: no apparent nausea or vomiting Anesthetic complications: no   No notable events documented.  Last Vitals:  Vitals:   09/07/23 1545 09/07/23 1600  BP: 111/60 (!) 138/98  Pulse: 76 68  Resp: (!) 4 16  Temp:  37.2 C  SpO2: 96% 95%    Last Pain:  Vitals:   09/08/23 0920  TempSrc:   PainSc: 1                  Edelmira Gallogly S

## 2023-09-11 DIAGNOSIS — F902 Attention-deficit hyperactivity disorder, combined type: Secondary | ICD-10-CM | POA: Diagnosis not present

## 2023-09-11 DIAGNOSIS — F411 Generalized anxiety disorder: Secondary | ICD-10-CM | POA: Diagnosis not present

## 2023-11-06 DIAGNOSIS — F411 Generalized anxiety disorder: Secondary | ICD-10-CM | POA: Diagnosis not present

## 2023-11-06 DIAGNOSIS — F902 Attention-deficit hyperactivity disorder, combined type: Secondary | ICD-10-CM | POA: Diagnosis not present
